# Patient Record
Sex: Male | Born: 1970 | Race: White | Hispanic: No | Marital: Married | Smoking: Never smoker
Health system: Southern US, Community
[De-identification: ages and names within clinical notes are randomized; demographics above are authoritative.]

## PROBLEM LIST (undated history)

## (undated) DIAGNOSIS — E785 Hyperlipidemia, unspecified: Secondary | ICD-10-CM

## (undated) DIAGNOSIS — J45909 Unspecified asthma, uncomplicated: Secondary | ICD-10-CM

## (undated) DIAGNOSIS — I10 Essential (primary) hypertension: Secondary | ICD-10-CM

## (undated) DIAGNOSIS — G4730 Sleep apnea, unspecified: Secondary | ICD-10-CM

## (undated) DIAGNOSIS — K649 Unspecified hemorrhoids: Secondary | ICD-10-CM

## (undated) DIAGNOSIS — E119 Type 2 diabetes mellitus without complications: Secondary | ICD-10-CM

## (undated) DIAGNOSIS — M5416 Radiculopathy, lumbar region: Secondary | ICD-10-CM

## (undated) DIAGNOSIS — Z8489 Family history of other specified conditions: Secondary | ICD-10-CM

## (undated) HISTORY — DX: Type 2 diabetes mellitus without complications: E11.9

## (undated) HISTORY — DX: Essential (primary) hypertension: I10

---

## 2015-08-27 ENCOUNTER — Encounter (HOSPITAL_COMMUNITY): Payer: Self-pay | Admitting: *Deleted

## 2015-08-27 ENCOUNTER — Emergency Department (HOSPITAL_COMMUNITY): Payer: Worker's Compensation

## 2015-08-27 ENCOUNTER — Emergency Department (HOSPITAL_COMMUNITY)
Admission: EM | Admit: 2015-08-27 | Discharge: 2015-08-27 | Disposition: A | Discharge status: Home / Self Care | Payer: Worker's Compensation | Attending: Emergency Medicine | Admitting: Emergency Medicine

## 2015-08-27 DIAGNOSIS — M5126 Other intervertebral disc displacement, lumbar region: Secondary | ICD-10-CM | POA: Insufficient documentation

## 2015-08-27 DIAGNOSIS — M79604 Pain in right leg: Secondary | ICD-10-CM

## 2015-08-27 DIAGNOSIS — M62830 Muscle spasm of back: Secondary | ICD-10-CM | POA: Diagnosis not present

## 2015-08-27 DIAGNOSIS — M545 Low back pain: Secondary | ICD-10-CM | POA: Diagnosis present

## 2015-08-27 DIAGNOSIS — M79605 Pain in left leg: Secondary | ICD-10-CM | POA: Insufficient documentation

## 2015-08-27 MED ORDER — DICLOFENAC SODIUM 50 MG PO TBEC: 50.0000 mg | DELAYED_RELEASE_TABLET | Freq: Two times a day (BID) | ORAL | Status: DC

## 2015-08-27 MED ORDER — OXYCODONE-ACETAMINOPHEN 5-325 MG PO TABS
1.0000 | ORAL_TABLET | Freq: Once | ORAL | Status: AC
Start: 2015-08-27 — End: 2015-08-27
  Administered 2015-08-27: 1 via ORAL
  Filled 2015-08-27: qty 1

## 2015-08-27 MED ORDER — PREDNISONE 10 MG PO TABS: 20.0000 mg | ORAL_TABLET | Freq: Two times a day (BID) | ORAL | Status: DC

## 2015-08-27 MED ORDER — OXYCODONE-ACETAMINOPHEN 7.5-325 MG PO TABS: 1.0000 | ORAL_TABLET | ORAL | Status: DC | PRN

## 2015-08-27 MED ORDER — KETOROLAC TROMETHAMINE 60 MG/2ML IM SOLN
60.0000 mg | Freq: Once | INTRAMUSCULAR | Status: AC
  Administered 2015-08-27: 60 mg via INTRAMUSCULAR
  Filled 2015-08-27: qty 2

## 2015-08-27 MED ORDER — CYCLOBENZAPRINE HCL 10 MG PO TABS
10.0000 mg | ORAL_TABLET | Freq: Once | ORAL | Status: AC
  Administered 2015-08-27: 10 mg via ORAL
  Filled 2015-08-27: qty 1

## 2015-08-27 MED ORDER — HYDROMORPHONE HCL 1 MG/ML IJ SOLN
1.0000 mg | Freq: Once | INTRAMUSCULAR | Status: AC
  Administered 2015-08-27: 1 mg via INTRAMUSCULAR
  Filled 2015-08-27: qty 1

## 2015-08-27 NOTE — ED Notes (Signed)
Declined W/C at D/C and was escorted to lobby by RN. 

## 2015-08-27 NOTE — Discharge Instructions (Signed)
[facility] tomorrow to schedule an appointment with the neurosurgeon. Do not drive or work while taking the narcotic. Return as needed.

## 2015-08-27 NOTE — ED Provider Notes (Signed)
CSN: [id number]     Arrival date & time 08/27/15  1234 History  By signing my name below, I, Jarvis Morgan, attest that this documentation has been prepared under the direction and in the presence of Kerrie Buffalo, NP Electronically Signed: Jarvis Morgan, ED Scribe. 08/27/2015. 8:51 PM.  Chief Complaint  Patient presents with  . Back Pain   Patient is a 44 y.o. male presenting with back pain. The history is provided by the patient. No language interpreter was used.  Back Pain Location:  Lumbar spine Radiates to:  L foot and R foot Pain severity:  Moderate Pain is:  Same all the time Onset quality:  Gradual Duration:  2 days Timing:  Constant Progression:  Waxing and waning Chronicity:  New Context: occupational injury and physical stress   Relieved by:  Nothing Worsened by:  Ambulation and standing Associated symptoms: no abdominal pain, no bladder incontinence, no bowel incontinence, no chest pain, no numbness, no perianal numbness, no tingling and no weakness     HPI Comments: Chalmer Zheng is a 44 y.o. male with no PMHx who presents to the Emergency Department complaining of constant, moderate, gradually worsening, 10/10, lower back pain onset 2 days. 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 Pt reports the pain is exacerbated with ambulation and standing straight up. He notes that he feels like both his knees have been "buckling" with ambulation since the injury. He endorses mild relief with rest and sitting. He denies h/o chronic back pain or back injuries. He is requesting a drug screen for his employer. Pt denies any urinary/bowel incontinence, urinary symptoms, abdominal pain, chest pain, saddles anesthesia, numbness, or weakness.   History reviewed. No pertinent past medical history. History reviewed. No pertinent past surgical  history. History reviewed. No pertinent family history. Social History  Substance Use Topics  . Smoking status: None  . Smokeless tobacco: None  . Alcohol Use: No    Review of Systems  Cardiovascular: Negative for chest pain.  Gastrointestinal: Negative for abdominal pain and bowel incontinence.  Genitourinary: Negative for bladder incontinence.  Musculoskeletal: Positive for back pain.       Bilateral lower extremity pain  Neurological: Negative for tingling, weakness and numbness.  All other systems reviewed and are negative.     Allergies  Bee venom  Home Medications   Prior to Admission medications   Medication Sig Start Date End Date Taking? Authorizing Provider  diclofenac (VOLTAREN) 50 MG EC tablet Take 1 tablet (50 mg total) by mouth 2 (two) times daily. 08/27/15   Talton Delpriore Orlene Och, NP  oxyCODONE-acetaminophen (PERCOCET) 7.5-325 MG tablet Take 1 tablet by mouth every 4 (four) hours as needed for severe pain. 08/27/15   Jemario Poitras Orlene Och, NP  predniSONE (DELTASONE) 10 MG tablet Take 2 tablets (20 mg total) by mouth 2 (two) times daily with a meal. 08/27/15   Janett Kamath Orlene Och, NP   Triage Vitals: BP 148/101 mmHg  Pulse 57  Temp(Src) 98.6 F (37 C) (Oral)  Resp 17  SpO2 98%  Physical Exam  Constitutional: He is oriented to person, place, and time. He appears well-developed and well-nourished. No distress.  HENT:  Head: Normocephalic and atraumatic.  Eyes: EOM are normal. Pupils are equal, round, and reactive to light.  Neck: Normal range of motion. Neck supple.  Cardiovascular: Normal rate and regular rhythm.   Pulses:      Radial pulses are 2+ on the right side, and 2+ on the left side.       Dorsalis pedis pulses are 2+ on the right side, and 2+ on the left side.  Adequate circulation  Pulmonary/Chest: Effort normal. No respiratory distress. He has no wheezes. He has no rales.  Abdominal: Soft. Bowel sounds are normal. There is no tenderness.  Musculoskeletal: He  exhibits no edema.       Lumbar back: He exhibits decreased range of motion, tenderness and spasm. He exhibits no deformity and normal pulse.       Back:  There is tenderness with palpation of the lumbar spine with pain radiating to bilateral lower extremities. Pedal pulses 2+, adequate circulation. Difficulty with ambulation due to severe pain.   Neurological: He is alert and oriented to person, place, and time. He has normal strength. No cranial nerve deficit or sensory deficit. Coordination and gait normal.  Reflex Scores:      Bicep reflexes are 2+ on the right side and 2+ on the left side.      Brachioradialis reflexes are 2+ on the right side and 2+ on the left side.      Patellar reflexes are 2+ on the right side and 2+ on the left side.      Achilles reflexes are 2+ on the right side and 2+ on the left side. Grips are equal,Good touch sensation, equal strength lower extremities. Ambulatory without foot drag.    Skin: Skin is warm and dry.  Psychiatric: He has a normal mood and affect. His behavior is normal.  Nursing note and vitals reviewed.   ED Course  Procedures (including critical care time) MRI of lumbar spine Pain management  DIAGNOSTIC STUDIES: Oxygen Saturation is 98% on RA, normal by my interpretation.    COORDINATION OF CARE: 1:44 PM- Will order MRI of lumbar spine w/o contrast.  Pt advised of plan for treatment and pt agrees.  Labs Review Labs Reviewed - No data to display  Imaging Review Mr Lumbar Spine Wo Contrast  08/27/2015   CLINICAL DATA:  44 year old male low back pain. Injured at work a few days ago. Initial encounter.  EXAM: MRI LUMBAR SPINE WITHOUT CONTRAST  TECHNIQUE: Multiplanar, multisequence MR imaging of the lumbar spine was performed. No intravenous contrast was administered.  COMPARISON:  None.  FINDINGS: Last fully open disk space is labeled L5-S1. Present examination incorporates from T11-12 disc space through S3. Conus L1 level.  Visualized  paravertebral structures unremarkable.  T11-12 through L3-4:  Negative.  L4-5: Mild disc degeneration. Bulge with small central protrusion. No significant spinal stenosis or nerve root compression.  L5-S1: Mild disc degeneration and endplate reactive changes. Dysplastic posterior elements. Bilateral L5 pars defects. Bulge without spinal stenosis. Minimal spur right foraminal/ lateral position without compression of exiting right L5 nerve root.  IMPRESSION: L4-5 bulge with small central protrusion. No significant spinal stenosis or nerve root compression.  L5-S1 mild disc degeneration and endplate reactive changes. Dysplastic posterior elements. Bilateral L5 pars defects. Bulge without spinal stenosis. Minimal spur right foraminal/ lateral position without compression of exiting right L5 nerve root.   Electronically Signed   By: Lacy Duverney M.D.   On: 08/27/2015 16:22   I have personally reviewed and evaluated these images results as part of my medical decision-making.  [facility] and they will call the patient with an  appointment for tomorrow or Wednesday.   MDM  44 y.o. male with low back pain that started 3 days ago and has gradually worsened. Will treat for pain and inflammation and he will follow up with the Neurosurgeon. They will call him tomorrow to schedule a time. Stable for d/c without neuro deficits at this time.   Final diagnoses:  Bilateral leg pain  Protruded lumbar disc   I personally performed the services described in this documentation, which was scribed in my presence. The recorded information has been reviewed and is accurate.   [address] [place], NP 08/27/15 2059  Mirian Mo, MD 09/01/15 1800

## 2015-08-27 NOTE — ED Notes (Addendum)
Pt reports injuring lower back while at work Saturday and having pain since. Needs a workers comp drug screen.

## 2017-01-18 IMAGING — MR MR LUMBAR SPINE W/O CM
4 of 5 series · 19 of 48 positions shown · non-contrast
Comparison: None.

CLINICAL DATA: 45-year-old male low back pain. Injured at work a
few days ago. Initial encounter.

EXAM:
MRI LUMBAR SPINE WITHOUT CONTRAST
TECHNIQUE: Multiplanar, multisequence MR imaging of the lumbar spine was
performed. No intravenous contrast was administered.

[Series 2: T2 · sagittal · 4.5mm · 0.55mm/px · 6 of 17 slices shown (1 of 2)]
[im 1/17]
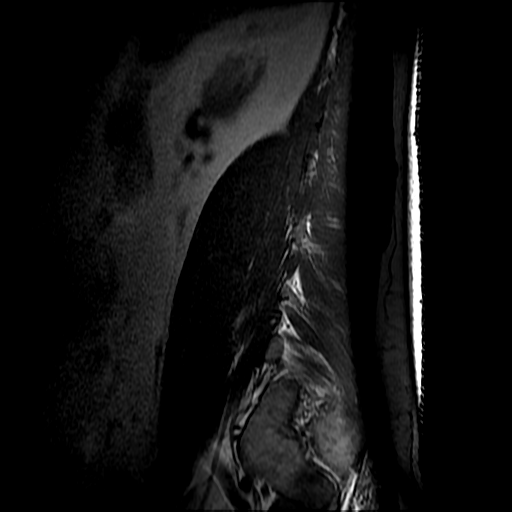
[im 4/17]
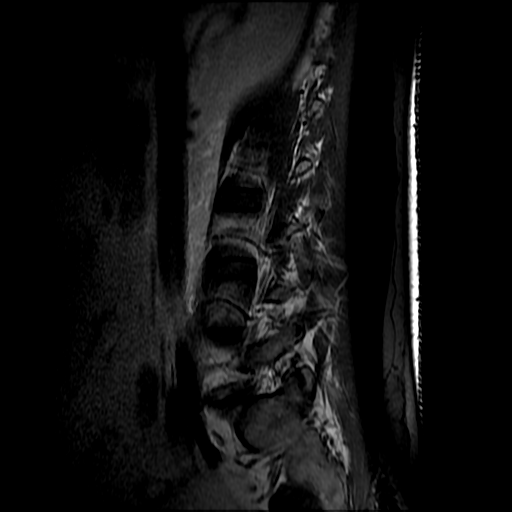
[im 7/17]
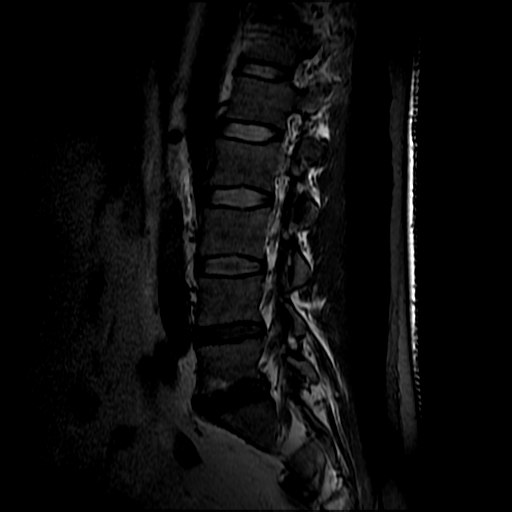
[im 10/17]
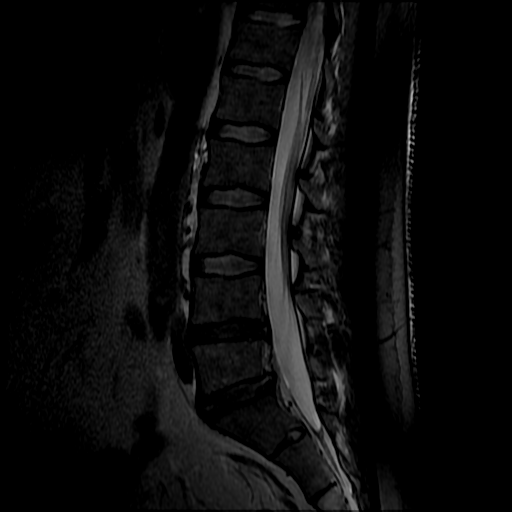
[im 13/17]
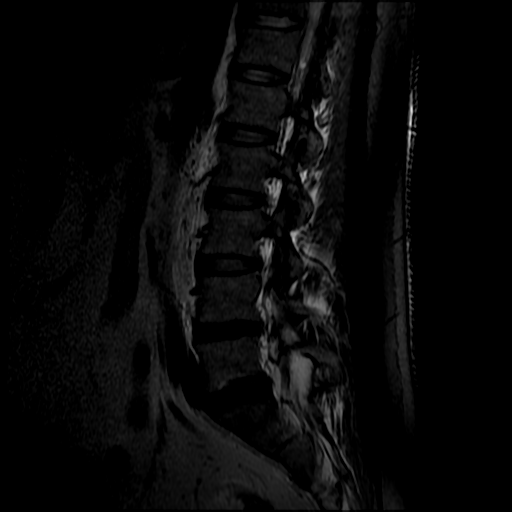
[im 17/17]
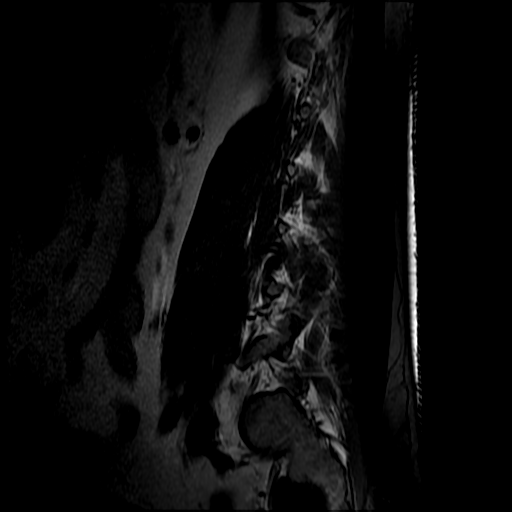

[Series 4: T1 · sagittal · 4.5mm · 0.55mm/px · 3 of 17 slices shown (1 of 2)]
[im 3/17]
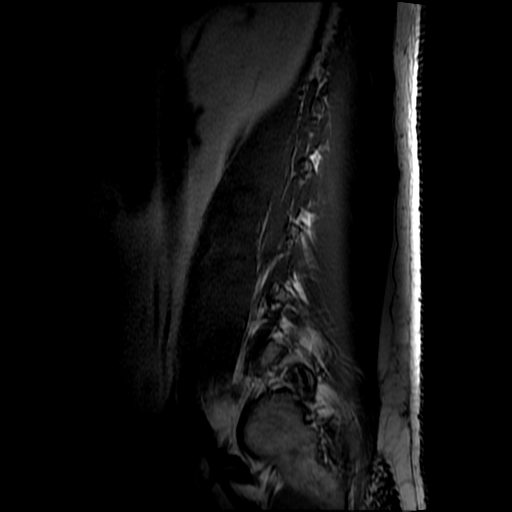
[im 9/17]
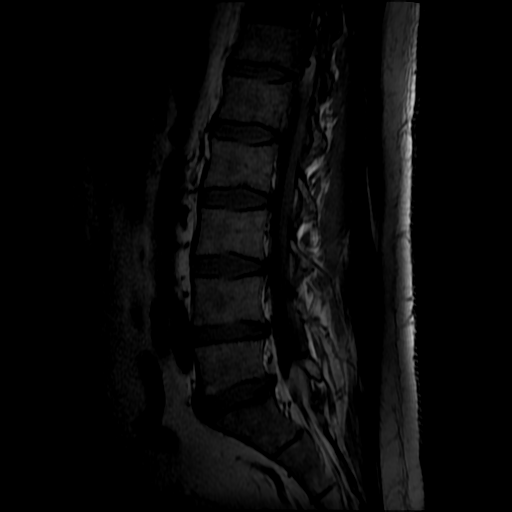
[im 14/17]
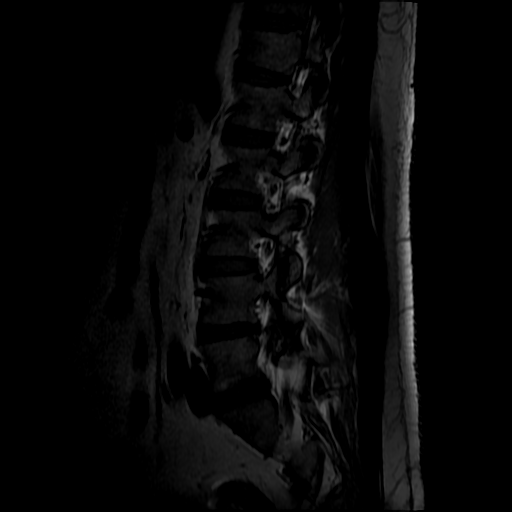

[Series 5: T2 · axial · 4.0mm · 0.39mm/px · z∈[-31,+126]mm · 7 of 35 slices shown (2 of 2)]
[im 1/35]
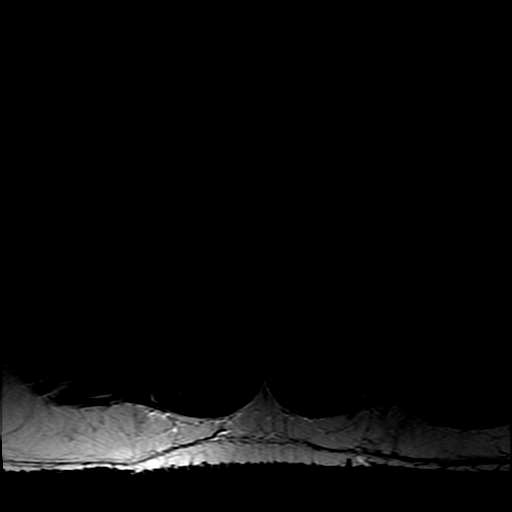
[im 6/35]
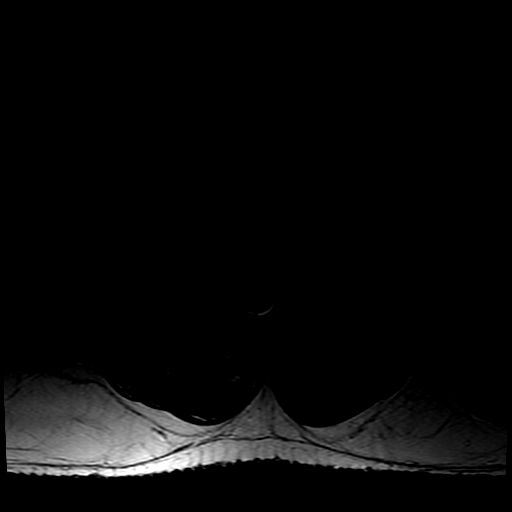
[im 11/35]
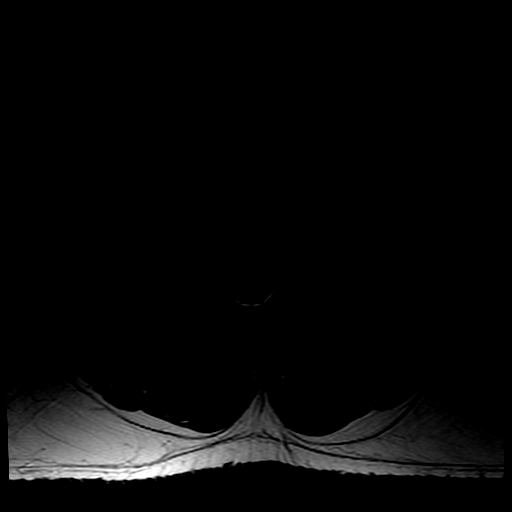
[im 16/35]
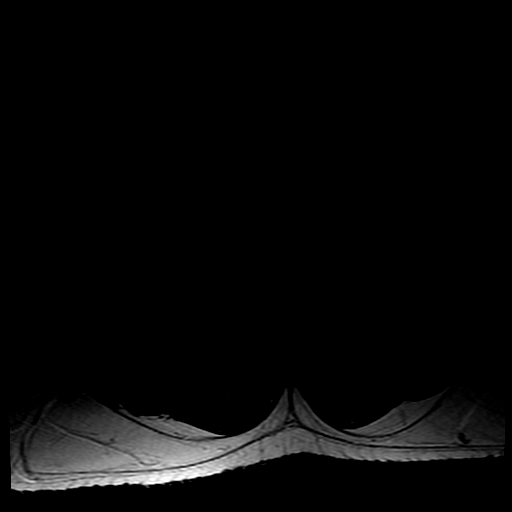
[im 19/35]
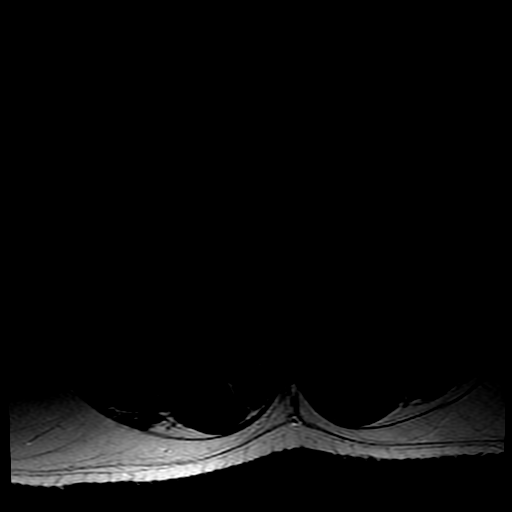
[im 24/35]
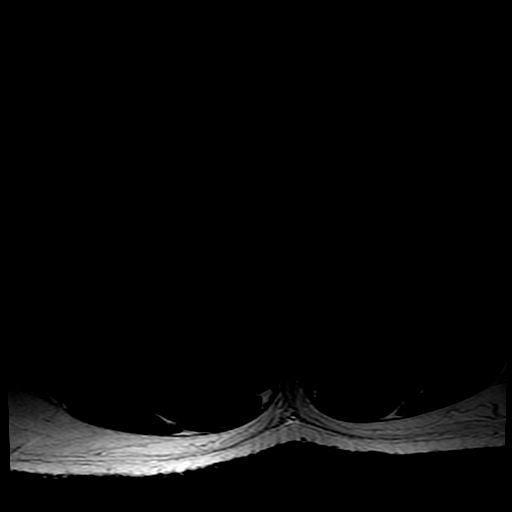
[im 29/35]
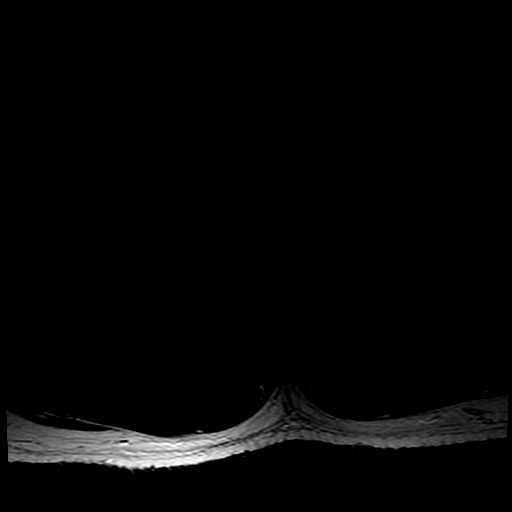

[Series 6: T1 · axial · 4.0mm · 0.39mm/px · z∈[-6,+126]mm · 3 of 35 slices shown (2 of 2)]
[im 6/35]
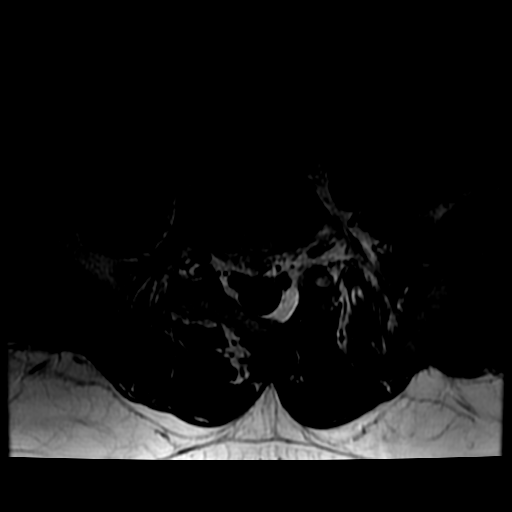
[im 19/35]
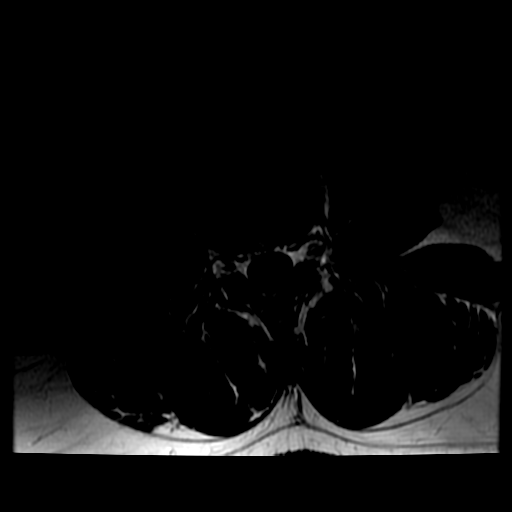
[im 29/35]
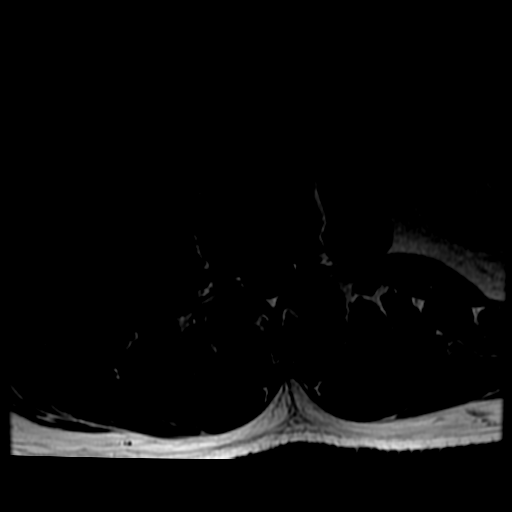

[19 of 48 positions shown; findings below may reference images not displayed]

FINDINGS: Last fully open disk space is labeled L5-S1. Present examination
incorporates from T11-12 disc space through S3. Conus L1 level.

Visualized paravertebral structures unremarkable.

T11-12 through L3-4:  Negative.

L4-5: Mild disc degeneration. Bulge with small central protrusion.
No significant spinal stenosis or nerve root compression.

L5-S1: Mild disc degeneration and endplate reactive changes.
Dysplastic posterior elements. Bilateral L5 pars defects. Bulge
without spinal stenosis. Minimal spur right foraminal/ lateral
position without compression of exiting right L5 nerve root.
IMPRESSION: L4-5 bulge with small central protrusion. No significant spinal
stenosis or nerve root compression.

L5-S1 mild disc degeneration and endplate reactive changes.
Dysplastic posterior elements. Bilateral L5 pars defects. Bulge
without spinal stenosis. Minimal spur right foraminal/ lateral
position without compression of exiting right L5 nerve root.

## 2019-10-07 ENCOUNTER — Ambulatory Visit
Admission: EM | Admit: 2019-10-07 | Discharge: 2019-10-07 | Disposition: A | Discharge status: Home / Self Care | Payer: BLUE CROSS/BLUE SHIELD | Attending: Physician Assistant | Admitting: Physician Assistant

## 2019-10-07 ENCOUNTER — Other Ambulatory Visit: Payer: Self-pay

## 2019-10-07 DIAGNOSIS — M545 Low back pain, unspecified: Secondary | ICD-10-CM

## 2019-10-07 MED ORDER — PREDNISONE 10 MG (21) PO TBPK: ORAL_TABLET | Freq: Every day | ORAL | 0 refills | Status: DC

## 2019-10-07 NOTE — ED Triage Notes (Signed)
Pt c/o lower back since Monday after moving a pool table

## 2019-10-07 NOTE — Discharge Instructions (Signed)
Start prednisone as directed. Ice/heat compresses as needed. This can take up to 3-4 weeks to completely resolve, but you should be feeling better each week. Follow up here or with PCP if symptoms worsen, changes for reevaluation. If experience numbness/tingling of the inner thighs, loss of bladder or bowel control, go to the emergency department for evaluation.

## 2019-10-07 NOTE — ED Provider Notes (Signed)
[facility]    CSN: [id number] Arrival date & time: 10/07/19  1548      History   Chief Complaint Chief Complaint  Patient presents with  . Back Pain    HPI Jeremiah Brock is a 48 y.o. male.   48 year old male comes in for 5-day history of right-sided back pain that can radiate to the left.  States pain started after moving a pool table.  Denies saddle anesthesia, loss of bladder or bowel control.  Denies radiation down the legs.  Pain is intermittent, worse with movement.  Has not tried anything for the symptoms.     History reviewed. No pertinent past medical history.  There are no active problems to display for this patient.   History reviewed. No pertinent surgical history.     Home Medications    Prior to Admission medications   Medication Sig Start Date End Date Taking? Authorizing Provider  predniSONE (STERAPRED UNI-PAK 21 TAB) 10 MG (21) TBPK tablet Take by mouth daily. Take 6 tabs by mouth day 1, then 5 tabs, then 4 tabs, then 3 tabs, 2 tabs, then 1 tab for the last day 10/07/19   Ok Edwards, PA-C    Family History No family history on file.  Social History Social History   Tobacco Use  . Smoking status: Never Smoker  . Smokeless tobacco: Never Used  Substance Use Topics  . Alcohol use: No  . Drug use: No     Allergies   Bee venom   Review of Systems Review of Systems  Reason unable to perform ROS: See HPI as above.     Physical Exam Triage Vital Signs ED Triage Vitals  Enc Vitals Group     BP 10/07/19 1617 134/87     Pulse Rate 10/07/19 1617 89     Resp 10/07/19 1617 16     Temp 10/07/19 1617 98.1 F (36.7 C)     Temp Source 10/07/19 1617 Oral     SpO2 10/07/19 1617 97 %     Weight --      Height --      Head Circumference --      Peak Flow --      Pain Score 10/07/19 1621 8     Pain Loc --      Pain Edu? --      Excl. in [place]? --    No data found.  Updated Vital Signs BP 134/87 (BP Location: Left Arm)    Pulse 89   Temp 98.1 F (36.7 C) (Oral)   Resp 16   SpO2 97%   Physical Exam Constitutional:      General: He is not in acute distress.    Appearance: He is well-developed. He is not diaphoretic.  HENT:     Head: Normocephalic and atraumatic.  Eyes:     Conjunctiva/sclera: Conjunctivae normal.     Pupils: Pupils are equal, round, and reactive to light.  Cardiovascular:     Rate and Rhythm: Normal rate and regular rhythm.     Heart sounds: Normal heart sounds. No murmur. No friction rub. No gallop.   Pulmonary:     Effort: Pulmonary effort is normal. No accessory muscle usage or respiratory distress.     Breath sounds: Normal breath sounds. No stridor. No decreased breath sounds, wheezing, rhonchi or rales.  Musculoskeletal:     Comments: No tenderness on palpation of the spinous processes. Tenderness to palpation of right lumbar region. Full range  of motion of back and hips. Strength normal and equal bilaterally. Sensation intact and equal bilaterally.  Negative straight leg raise.   Skin:    General: Skin is warm and dry.  Neurological:     Mental Status: He is alert and oriented to person, place, and time.    UC Treatments / Results  Labs (all labs ordered are listed, but only abnormal results are displayed) Labs Reviewed - No data to display  EKG   Radiology No results found.  Procedures Procedures (including critical care time)  Medications Ordered in UC Medications - No data to display  Initial Impression / Assessment and Plan / UC Course  I have reviewed the triage vital signs and the nursing notes.  Pertinent labs & imaging results that were available during my care of the patient were reviewed by me and considered in my medical decision making (see chart for details).    No alarming signs on exam.  Patient states history of the same, and has had good results with prednisone.  Prednisone prescribed.  Patient would like to defer muscle relaxer at this time.   Return precautions given.  Patient expresses understanding and agrees to plan.  Final Clinical Impressions(s) / UC Diagnoses   Final diagnoses:  Acute right-sided low back pain without sciatica   ED Prescriptions    Medication Sig Dispense Auth. Provider   predniSONE (STERAPRED UNI-PAK 21 TAB) 10 MG (21) TBPK tablet Take by mouth daily. Take 6 tabs by mouth day 1, then 5 tabs, then 4 tabs, then 3 tabs, 2 tabs, then 1 tab for the last day 21 tablet Belinda Fisher, PA-C     PDMP not reviewed this encounter.   Belinda Fisher, PA-C 10/07/19 1824

## 2020-02-01 ENCOUNTER — Other Ambulatory Visit: Payer: Self-pay

## 2020-02-01 ENCOUNTER — Encounter: Payer: Self-pay | Admitting: Internal Medicine

## 2020-02-01 ENCOUNTER — Other Ambulatory Visit
Admission: RE | Admit: 2020-02-01 | Discharge: 2020-02-01 | Disposition: A | Discharge status: Home / Self Care | Payer: 59 | Source: Ambulatory Visit | Attending: Internal Medicine | Admitting: Internal Medicine

## 2020-02-01 DIAGNOSIS — Z01812 Encounter for preprocedural laboratory examination: Secondary | ICD-10-CM | POA: Diagnosis not present

## 2020-02-01 DIAGNOSIS — Z20822 Contact with and (suspected) exposure to covid-19: Secondary | ICD-10-CM | POA: Diagnosis not present

## 2020-02-02 ENCOUNTER — Ambulatory Visit: Admission: RE | Admit: 2020-02-02 | Payer: 59 | Source: Home / Self Care | Admitting: Internal Medicine

## 2020-02-02 ENCOUNTER — Encounter: Admission: RE | Payer: Self-pay | Source: Home / Self Care

## 2020-02-02 LAB — SARS CORONAVIRUS 2 (TAT 6-24 HRS): SARS Coronavirus 2: NEGATIVE

## 2020-02-02 SURGERY — COLONOSCOPY WITH PROPOFOL
Anesthesia: General

## 2021-12-13 DIAGNOSIS — G4733 Obstructive sleep apnea (adult) (pediatric): Secondary | ICD-10-CM | POA: Diagnosis not present

## 2022-01-06 DIAGNOSIS — Z87891 Personal history of nicotine dependence: Secondary | ICD-10-CM | POA: Diagnosis not present

## 2022-01-06 DIAGNOSIS — R079 Chest pain, unspecified: Secondary | ICD-10-CM | POA: Diagnosis not present

## 2022-01-06 DIAGNOSIS — E119 Type 2 diabetes mellitus without complications: Secondary | ICD-10-CM | POA: Diagnosis not present

## 2022-01-06 DIAGNOSIS — I10 Essential (primary) hypertension: Secondary | ICD-10-CM | POA: Diagnosis not present

## 2022-01-06 DIAGNOSIS — G4733 Obstructive sleep apnea (adult) (pediatric): Secondary | ICD-10-CM | POA: Diagnosis not present

## 2022-01-06 DIAGNOSIS — Z1331 Encounter for screening for depression: Secondary | ICD-10-CM | POA: Diagnosis not present

## 2022-01-06 DIAGNOSIS — M25511 Pain in right shoulder: Secondary | ICD-10-CM | POA: Diagnosis not present

## 2022-01-13 DIAGNOSIS — G4733 Obstructive sleep apnea (adult) (pediatric): Secondary | ICD-10-CM | POA: Diagnosis not present

## 2022-02-10 DIAGNOSIS — G4733 Obstructive sleep apnea (adult) (pediatric): Secondary | ICD-10-CM | POA: Diagnosis not present

## 2022-03-13 DIAGNOSIS — G4733 Obstructive sleep apnea (adult) (pediatric): Secondary | ICD-10-CM | POA: Diagnosis not present

## 2022-04-12 DIAGNOSIS — G4733 Obstructive sleep apnea (adult) (pediatric): Secondary | ICD-10-CM | POA: Diagnosis not present

## 2022-04-17 DIAGNOSIS — M5136 Other intervertebral disc degeneration, lumbar region: Secondary | ICD-10-CM | POA: Diagnosis not present

## 2022-04-17 DIAGNOSIS — M545 Low back pain, unspecified: Secondary | ICD-10-CM | POA: Diagnosis not present

## 2022-05-13 DIAGNOSIS — G4733 Obstructive sleep apnea (adult) (pediatric): Secondary | ICD-10-CM | POA: Diagnosis not present

## 2022-08-30 DIAGNOSIS — M545 Low back pain, unspecified: Secondary | ICD-10-CM | POA: Diagnosis not present

## 2023-03-02 ENCOUNTER — Encounter: Payer: Self-pay | Admitting: Nurse Practitioner

## 2023-04-29 DIAGNOSIS — I10 Essential (primary) hypertension: Secondary | ICD-10-CM | POA: Insufficient documentation

## 2023-04-29 DIAGNOSIS — S99912A Unspecified injury of left ankle, initial encounter: Secondary | ICD-10-CM | POA: Diagnosis present

## 2023-04-29 DIAGNOSIS — S82842A Displaced bimalleolar fracture of left lower leg, initial encounter for closed fracture: Secondary | ICD-10-CM | POA: Diagnosis not present

## 2023-04-30 ENCOUNTER — Emergency Department: Payer: BLUE CROSS/BLUE SHIELD

## 2023-04-30 ENCOUNTER — Emergency Department
Admission: EM | Admit: 2023-04-30 | Discharge: 2023-04-30 | Disposition: A | Discharge status: Home / Self Care | Payer: BLUE CROSS/BLUE SHIELD | Attending: Emergency Medicine | Admitting: Emergency Medicine

## 2023-04-30 ENCOUNTER — Other Ambulatory Visit: Payer: Self-pay

## 2023-04-30 DIAGNOSIS — S82842A Displaced bimalleolar fracture of left lower leg, initial encounter for closed fracture: Secondary | ICD-10-CM

## 2023-04-30 MED ORDER — ACETAMINOPHEN 500 MG PO TABS
1000.0000 mg | ORAL_TABLET | Freq: Once | ORAL | Status: DC
  Filled 2023-04-30: qty 2

## 2023-04-30 MED ORDER — IBUPROFEN 400 MG PO TABS
400.0000 mg | ORAL_TABLET | Freq: Once | ORAL | Status: AC
  Administered 2023-04-30: 400 mg via ORAL
  Filled 2023-04-30: qty 1

## 2023-04-30 MED ORDER — OXYCODONE HCL 5 MG PO TABS: 5.0000 mg | ORAL_TABLET | Freq: Three times a day (TID) | ORAL | 0 refills | Status: AC | PRN

## 2023-04-30 MED ORDER — OXYCODONE HCL 5 MG PO TABS
5.0000 mg | ORAL_TABLET | Freq: Once | ORAL | Status: AC
  Administered 2023-04-30: 5 mg via ORAL
  Filled 2023-04-30: qty 1
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
## 2023-04-30 NOTE — Discharge Instructions (Signed)
Please keep the splint in place and do not put any weight on your ankle.  You can take Tylenol Motrin for pain and the oxycodone as daily as needed for severe pain.  You will need surgery for this injury.  Please call podiatry in the morning to schedule an appointment.

## 2023-11-18 DIAGNOSIS — B029 Zoster without complications: Secondary | ICD-10-CM

## 2023-11-18 HISTORY — DX: Zoster without complications: B02.9

## 2024-10-22 ENCOUNTER — Encounter: Payer: Self-pay | Admitting: Emergency Medicine

## 2024-10-22 ENCOUNTER — Emergency Department
Admission: EM | Admit: 2024-10-22 | Discharge: 2024-10-22 | Disposition: A | Discharge status: Home / Self Care | Attending: Emergency Medicine | Admitting: Emergency Medicine

## 2024-10-22 ENCOUNTER — Other Ambulatory Visit: Payer: Self-pay

## 2024-10-22 DIAGNOSIS — K645 Perianal venous thrombosis: Secondary | ICD-10-CM | POA: Insufficient documentation

## 2024-10-22 LAB — BASIC METABOLIC PANEL WITH GFR
Anion gap: 13 (ref 5–15)
BUN: 18 mg/dL (ref 6–20)
CO2: 22 mmol/L (ref 22–32)
Calcium: 8.1 mg/dL — ABNORMAL LOW (ref 8.9–10.3)
Chloride: 96 mmol/L — ABNORMAL LOW (ref 98–111)
Creatinine, Ser: 0.89 mg/dL (ref 0.61–1.24)
GFR, Estimated: >60 mL/min (ref >60)
Glucose, Bld: 114 mg/dL — ABNORMAL HIGH (ref 70–99)
Potassium: 4.4 mmol/L (ref 3.5–5.1)
Sodium: 131 mmol/L — ABNORMAL LOW (ref 135–145)

## 2024-10-22 LAB — CBC WITH DIFFERENTIAL/PLATELET
Abs Immature Granulocytes: 0.74 K/uL — ABNORMAL HIGH (ref 0.00–0.07)
Basophils Absolute: 0.1 K/uL (ref 0.0–0.1)
Basophils Relative: 1 %
Eosinophils Absolute: 0 K/uL (ref 0.0–0.5)
Eosinophils Relative: 0 %
HCT: 48.7 % (ref 39.0–52.0)
Hemoglobin: 14.9 g/dL (ref 13.0–17.0)
Immature Granulocytes: 4 %
Lymphocytes Relative: 10 %
Lymphs Abs: 1.7 K/uL (ref 0.7–4.0)
MCH: 23.3 pg — ABNORMAL LOW (ref 26.0–34.0)
MCHC: 30.6 g/dL (ref 30.0–36.0)
MCV: 76.2 fL — ABNORMAL LOW (ref 80.0–100.0)
Monocytes Absolute: 1.1 K/uL — ABNORMAL HIGH (ref 0.1–1.0)
Monocytes Relative: 6 %
Neutro Abs: 13.9 K/uL — ABNORMAL HIGH (ref 1.7–7.7)
Neutrophils Relative %: 79 %
Platelets: 256 K/uL (ref 150–400)
RBC: 6.39 MIL/uL — ABNORMAL HIGH (ref 4.22–5.81)
RDW: 16.1 % — ABNORMAL HIGH (ref 11.5–15.5)
WBC: 17.6 K/uL — ABNORMAL HIGH (ref 4.0–10.5)
nRBC: 0.1 % (ref 0.0–0.2)

## 2024-10-22 MED ORDER — LIDOCAINE HCL (PF) 1 % IJ SOLN
INTRAMUSCULAR | Status: DC
  Filled 2024-10-22: qty 10

## 2024-10-22 MED ORDER — OXYCODONE-ACETAMINOPHEN 5-325 MG PO TABS: 2.0000 | ORAL_TABLET | ORAL | 0 refills | Status: AC | PRN

## 2024-10-22 MED ORDER — SENNOSIDES-DOCUSATE SODIUM 8.6-50 MG PO TABS: 2.0000 | ORAL_TABLET | Freq: Two times a day (BID) | ORAL | 0 refills | Status: DC

## 2024-10-22 MED ORDER — LIDOCAINE-EPINEPHRINE 2 %-1:100000 IJ SOLN
20.0000 mL | Freq: Once | INTRAMUSCULAR | Status: AC
  Administered 2024-10-22: 20 mL
  Filled 2024-10-22: qty 1

## 2024-10-22 MED ORDER — HYDROMORPHONE HCL 1 MG/ML IJ SOLN
1.0000 mg | Freq: Once | INTRAMUSCULAR | Status: AC
  Administered 2024-10-22: 1 mg via INTRAVENOUS
  Filled 2024-10-22: qty 1

## 2024-10-22 MED ORDER — OXYCODONE-ACETAMINOPHEN 5-325 MG PO TABS
2.0000 | ORAL_TABLET | Freq: Once | ORAL | Status: AC
  Administered 2024-10-22: 2 via ORAL
  Filled 2024-10-22: qty 2

## 2024-10-22 MED ORDER — LIDOCAINE-EPINEPHRINE-TETRACAINE (LET) TOPICAL GEL
3.0000 mL | Freq: Once | TOPICAL | Status: AC
  Administered 2024-10-22: 3 mL via TOPICAL
  Filled 2024-10-22: qty 3

## 2024-10-22 MED ORDER — KETOROLAC TROMETHAMINE 15 MG/ML IJ SOLN
15.0000 mg | Freq: Once | INTRAMUSCULAR | Status: AC
  Administered 2024-10-22: 15 mg via INTRAVENOUS
  Filled 2024-10-22: qty 1

## 2024-10-22 MED ORDER — HYDROCORTISONE ACETATE 25 MG RE SUPP: 25.0000 mg | Freq: Two times a day (BID) | RECTAL | 0 refills | Status: AC

## 2024-10-22 NOTE — ED Provider Notes (Signed)
 [facility] Provider Note    Event Date/Time   First MD Initiated Contact with Patient 10/22/24 1747     (approximate)   History   Chief Complaint: Rectal Pain   HPI  Jeremiah Brock is a 53 y.o. male with past history of stomach ulcers who comes ED complaining of bulging at the anus and pain with trying to have a bowel movement.  He has been having issues with constipation and straining, took MiraLAX earlier today, but then when he went to the bathroom he noticed fresh red bleeding into the toilet.  Denies chest pain shortness of breath fever or lightheadedness        History reviewed. No pertinent past medical history.  Current Outpatient Rx   Order #: [id number] Class: Normal   Order #: [id number] Class: Normal   Order #: [id number] Class: Normal   Order #: [id number] Class: Normal    History reviewed. No pertinent surgical history.  Physical Exam   Triage Vital Signs: ED Triage Vitals  Encounter Vitals Group     BP 10/22/24 1737 (!) 128/90     Girls Systolic BP Percentile --      Girls Diastolic BP Percentile --      Boys Systolic BP Percentile --      Boys Diastolic BP Percentile --      Pulse Rate 10/22/24 1737 (!) 103     Resp 10/22/24 1737 19     Temp 10/22/24 1737 98 F (36.7 C)     Temp Source 10/22/24 1737 Oral     SpO2 10/22/24 1737 100 %     Weight 10/22/24 1737 238 lb (108 kg)     Height 10/22/24 1737 6' 3 (1.905 m)     Head Circumference --      Peak Flow --      Pain Score 10/22/24 1742 10     Pain Loc --      Pain Education --      Exclude from Growth Chart --     Most recent vital signs: Vitals:   10/22/24 1737  BP: (!) 128/90  Pulse: (!) 103  Resp: 19  Temp: 98 F (36.7 C)  SpO2: 100%    General: Awake, no distress.  CV:  Good peripheral perfusion.  Regular rate rhythm Resp:  Normal effort.  Abd:  No distention.  Soft nontender Other:  Anal exam shows large circumferential external hemorrhoids with  thrombosis   ED Results / Procedures / Treatments   Labs (all labs ordered are listed, but only abnormal results are displayed) Labs Reviewed  BASIC METABOLIC PANEL WITH GFR - Abnormal; Notable for the following components:      Result Value   Sodium 131 (*)    Chloride 96 (*)    Glucose, Bld 114 (*)    Calcium 8.1 (*)    All other components within normal limits  CBC WITH DIFFERENTIAL/PLATELET - Abnormal; Notable for the following components:   WBC 17.6 (*)    RBC 6.39 (*)    MCV 76.2 (*)    MCH 23.3 (*)    RDW 16.1 (*)    Neutro Abs 13.9 (*)    Monocytes Absolute 1.1 (*)    Abs Immature Granulocytes 0.74 (*)    All other components within normal limits  URINALYSIS, W/ REFLEX TO CULTURE (INFECTION SUSPECTED)     EKG    RADIOLOGY    PROCEDURES:  Procedures   MEDICATIONS ORDERED IN ED: Medications  lidocaine  (  PF) (XYLOCAINE ) 1 % injection (has no administration in time range)  HYDROmorphone  (DILAUDID ) injection 1 mg (1 mg Intravenous Given 10/22/24 1805)  lidocaine -EPINEPHrine  (XYLOCAINE  W/EPI) 2 %-1:100000 (with pres) injection 20 mL (20 mLs Infiltration Given 10/22/24 1828)  lidocaine -EPINEPHrine -tetracaine  (LET) topical gel (3 mLs Topical Given 10/22/24 1848)  HYDROmorphone  (DILAUDID ) injection 1 mg (1 mg Intravenous Given 10/22/24 1910)  ketorolac  (TORADOL ) 15 MG/ML injection 15 mg (15 mg Intravenous Given 10/22/24 2113)  oxyCODONE -acetaminophen  (PERCOCET/ROXICET) 5-325 MG per tablet 2 tablet (2 tablets Oral Given 10/22/24 2147)     IMPRESSION / MDM / ASSESSMENT AND PLAN / ED COURSE  I reviewed the triage vital signs and the nursing notes.  DDx: Anemia, electrolyte derangement, AKI, thrombocytopenia  Patient's presentation is most consistent with acute presentation with potential threat to life or bodily function.  Patient presents with markedly engorged and thrombosed external hemorrhoids causing severe pain.  Will attempt thrombectomy.   Clinical Course  as of 10/22/24 2155  Sat Oct 22, 2024  1904 LET applied. Will give repeat dose of dilaudid . Pt agrees with hemorrhoid excision / decompression [PS]    Clinical Course User Index [PS] Viviann Pastor, MD    ----------------------------------------- 9:57 PM on 10/22/2024 ----------------------------------------- Hemorrhoid incision and thrombectomy attempted, but unable to achieve with local anesthetic alone within patient tolerance of the procedure.  The size and circumferential nature also complicates the procedure.  Will provide supportive care, recommend follow-up with gen. surgery this week for further management.   FINAL CLINICAL IMPRESSION(S) / ED DIAGNOSES   Final diagnoses:  Thrombosed external hemorrhoids     Rx / DC Orders   ED Discharge Orders          Ordered    oxyCODONE -acetaminophen  (PERCOCET) 5-325 MG tablet  Every 4 hours PRN        10/22/24 2155    hydrocortisone  (ANUSOL -HC) 25 MG suppository  Every 12 hours        10/22/24 2155    senna-docusate (SENOKOT-S) 8.6-50 MG tablet  2 times daily        10/22/24 2155             Note:  This document was prepared using Dragon voice recognition software and may include unintentional dictation errors.   Viviann Pastor, MD 10/22/24 2159

## 2024-10-22 NOTE — ED Triage Notes (Signed)
 Patient reports this morning when going to bathroom having rectal prolapse. Unable to void since.

## 2024-10-22 NOTE — ED Notes (Signed)
 MD at bedside performing procedure. Pt tolerating well

## 2024-10-24 ENCOUNTER — Ambulatory Visit: Admitting: Surgery

## 2024-10-24 ENCOUNTER — Telehealth: Payer: Self-pay | Admitting: Surgery

## 2024-10-24 ENCOUNTER — Encounter: Payer: Self-pay | Admitting: Surgery

## 2024-10-24 VITALS — BP 129/85 | HR 98 | Ht 75.0 in | Wt 234.0 lb

## 2024-10-24 DIAGNOSIS — K645 Perianal venous thrombosis: Secondary | ICD-10-CM

## 2024-10-24 DIAGNOSIS — K644 Residual hemorrhoidal skin tags: Secondary | ICD-10-CM

## 2024-10-24 MED ORDER — LIDOCAINE 5 % EX OINT: 1.0000 | TOPICAL_OINTMENT | CUTANEOUS | 0 refills | Status: DC | PRN

## 2024-10-24 MED ORDER — OXYCODONE HCL 5 MG PO TABS: 5.0000 mg | ORAL_TABLET | Freq: Four times a day (QID) | ORAL | 0 refills | Status: DC | PRN

## 2024-10-24 MED ORDER — HYDROCORTISONE (PERIANAL) 2.5 % EX CREA: 1.0000 | TOPICAL_CREAM | Freq: Two times a day (BID) | CUTANEOUS | 0 refills | Status: AC

## 2024-10-24 NOTE — Telephone Encounter (Signed)
 Pt was in ER on 10-22-24 in severe pain with 2 hominoids and was told that he needed surgery ASAP. The also went in and cut them and patient wife said was very gross (pt has video) and had very little numbing medications. Said the pt had the same needle stick him at least 30 or more. He is in severe pain and has an apt thru My Chart for 10-31-24 but is here in the office now and you have noting till then. Please advise. Pt said would wait till we here from you as what they can do.Pt # is [phone].

## 2024-10-24 NOTE — Patient Instructions (Addendum)
 We sent 3 prescriptions to your pharmacy and we will see you back in   Hemorrhoids Hemorrhoids are swollen veins that may form: In the butt (rectum). These are called internal hemorrhoids. Around the opening of the butt (anus). These are called external hemorrhoids. Most hemorrhoids do not cause very bad problems. They often get better with changes to your lifestyle and what you eat. What are the causes? Having trouble pooping (constipation) or watery poop (diarrhea). Pushing too hard when you poop. Pregnancy. Being very overweight (obese). Sitting for too long. Riding a bike for a long time. Heavy lifting or other things that take a lot of effort. Anal sex. What are the signs or symptoms? Pain. Itching or soreness in the butt. Bleeding from the butt. Leaking poop. Swelling. One or more lumps around the opening of your butt. How is this treated? In most cases, hemorrhoids can be treated at home. You may be told to: Change what you eat. Make changes to your lifestyle. If these treatments do not help, you may need to have a procedure done. Your doctor may need to: Place rubber bands at the bottom of the hemorrhoids to make them fall off. Put medicine into the hemorrhoids to shrink them. Shine a type of light on the hemorrhoids to cause them to fall off. Do surgery to get rid of the hemorrhoids. Follow these instructions at home: Medicines Take over-the-counter and prescription medicines only as told by your doctor. Use creams with medicine in them or medicines that you put in your butt as told by your doctor. Eating and drinking  Eat foods that have a lot of fiber in them. These include whole grains, beans, nuts, fruits, and vegetables. Ask your doctor about taking products that have fiber added to them (fibersupplements). Take in less fat. You can do this by: Eating low-fat dairy products. Eating less red meat. Staying away from processed foods. Drink enough fluid to keep  your pee (urine) pale yellow. Managing pain and swelling  Take a warm-water bath (sitz bath) for 20 minutes to ease pain. Do this 3-4 times a day. You may do this in a bathtub. You may also use a portable sitz bath that fits over the toilet. If told, put ice on the painful area. It may help to use ice between your warm baths. Put ice in a plastic bag. Place a towel between your skin and the bag. Leave the ice on for 20 minutes, 2-3 times a day. If your skin turns bright red, take off the ice right away to prevent skin damage. The risk of damage is higher if you cannot feel pain, heat, or cold. General instructions Exercise. Ask your doctor how much and what kind of exercise is best for you. Go to the bathroom when you need to poop. Do not wait. Try not to push too hard when you poop. Keep your butt dry and clean. Use wet toilet paper or moist towelettes after you poop. Do not sit on the toilet for a long time. Contact a doctor if: You have pain and swelling that do not get better with treatment. You have trouble pooping. You cannot poop. You have pain or swelling outside the area of the hemorrhoids. Get help right away if: You have bleeding from the butt that will not stop. This information is not intended to replace advice given to you by your health care provider. Make sure you discuss any questions you have with your health care provider. Document Revised: 07/16/2022  Document Reviewed: 07/16/2022 Elsevier Patient Education  2024 [employer].

## 2024-10-25 ENCOUNTER — Encounter: Payer: Self-pay | Admitting: Surgery

## 2024-10-25 NOTE — Progress Notes (Signed)
 10/24/2024  Reason for Visit: Enlarged internal/external hemorrhoids  History of Present Illness: Jeremiah Brock is a 53 y.o. male presenting for evaluation of enlarged and tender internal/external hemorrhoids.  The patient has a history of this in the past and multiple episodes of flareups.  These usually go down on their own.  However he reports that this past week, he started having worsening perianal pain and this was not letting down.  He presented to the emergency room on 10/22/2024 and was diagnosed with thrombosed external hemorrhoid.  He underwent incision to evacuate clot but this was very difficult due to the inflammation and the size of the hemorrhoids.  The patient reports that this was very tender overall and took many hours.  He was recommended to contact our office to set up an appointment to consider surgery for hemorrhoidectomy.  The patient reports that his pain has continued to worsen despite other medications given he walked into our office today in the hopes to be seen.  Denies any fevers, chills, chest pain, shortness of breath.  Reports only trying to drink some liquids due to concerns for having bowel movements due to the pain.  He has been doing sitz bath's or showers and these have been helping.  However he is still in a lot of pain.  He has noted some bleeding but no significant amount of blood.  Past Medical History: History reviewed. No pertinent past medical history.   Past Surgical History: History reviewed. No pertinent surgical history.  Home Medications: Prior to Admission medications   Medication Sig Start Date End Date Taking? Authorizing Provider  atorvastatin (LIPITOR) 20 MG tablet Take 20 mg by mouth daily.   Yes [provider]  empagliflozin (JARDIANCE) 10 MG TABS tablet Take by mouth daily.   Yes [provider]  fenofibrate (TRICOR) 145 MG tablet Take 145 mg by mouth daily.   Yes [provider]  gabapentin (NEURONTIN) 300  MG capsule Take 300 mg by mouth 3 (three) times daily.   Yes [provider]  hydrocortisone  (ANUSOL -HC) 2.5 % rectal cream Place 1 Application rectally 2 (two) times daily for 14 days. 10/24/24 11/07/24 Yes Mathew Postiglione, [name], MD  lidocaine  (LMX) 4 % cream Apply 1 Application topically as needed.   Yes [provider]  lidocaine  (XYLOCAINE ) 5 % ointment Apply 1 Application topically as needed. 10/24/24  Yes Kalisa Girtman, [name], MD  losartan (COZAAR) 50 MG tablet Take 50 mg by mouth daily.   Yes [provider]  metFORMIN (GLUCOPHAGE) 1000 MG tablet Take 1,000 mg by mouth 2 (two) times daily with a meal.   Yes [provider]  methocarbamol (ROBAXIN) 500 MG tablet Take 500 mg by mouth 4 (four) times daily.   Yes [provider]  oxyCODONE  (ROXICODONE ) 5 MG immediate release tablet Take 1 tablet (5 mg total) by mouth every 6 (six) hours as needed for severe pain (pain score 7-10). 10/24/24 10/24/25 Yes Amoreena Neubert, [name], MD  valACYclovir (VALTREX) 1000 MG tablet Take 1,000 mg by mouth 2 (two) times daily.   Yes [provider]  hydrocortisone  (ANUSOL -HC) 25 MG suppository Place 1 suppository (25 mg total) rectally every 12 (twelve) hours for 7 days. 10/22/24 10/29/24  Viviann Pastor, MD  oxyCODONE -acetaminophen  (PERCOCET) 5-325 MG tablet Take 2 tablets by mouth every 4 (four) hours as needed for up to 3 days for severe pain (pain score 7-10). 10/22/24 10/25/24  Viviann Pastor, MD  predniSONE  (STERAPRED UNI-PAK 21 TAB) 10 MG (21) TBPK tablet  Take by mouth daily. Take 6 tabs by mouth day 1, then 5 tabs, then 4 tabs, then 3 tabs, 2 tabs, then 1 tab for the last day 10/07/19   Babara, Amy V, PA-C  senna-docusate (SENOKOT-S) 8.6-50 MG tablet Take 2 tablets by mouth 2 (two) times daily. 10/22/24   Viviann Pastor, MD    Allergies: Allergies  Allergen Reactions   Bee Venom     Social History:  reports that he has never smoked. He has never used smokeless tobacco. He  reports that he does not drink alcohol and does not use drugs.   Family History: History reviewed. No pertinent family history.  Review of Systems: Review of Systems  Constitutional:  Negative for chills and fever.  Respiratory:  Negative for shortness of breath.   Cardiovascular:  Negative for chest pain.  Gastrointestinal:  Positive for blood in stool and constipation. Negative for nausea and vomiting.       Significant perianal pain    Physical Exam BP 129/85   Pulse 98   Ht 6' 3 (1.905 m)   Wt 234 lb (106.1 kg)   SpO2 98%   BMI 29.25 kg/m  CONSTITUTIONAL: Appears to be in some pain distress. HEENT:  Normocephalic, atraumatic, extraocular motion intact. RESPIRATORY:  Normal respiratory effort without pathologic use of accessory muscles. CARDIOVASCULAR: Regular rhythm and rate. RECTAL: External exam reveals enlarged and very inflamed external hemorrhoids of all 3 columns.  There is evidence of some bleeding on his underwear and it appears that the internal hemorrhoids are also inflamed.  The incisions from emergency room appear to be healing but everything is very inflamed.  No evidence of thrombosis at this point.  Digital rectal exam was not done today due to the patient's pain. MUSCULOSKELETAL:  Normal muscle strength and tone in all four extremities.  No peripheral edema or cyanosis. NEUROLOGIC:  Motor and sensation is grossly normal.  Cranial nerves are grossly intact. PSYCH:  Alert and oriented to person, place and time. Affect is normal.  Laboratory Analysis: Labs from 10/22/2024: Sodium 131, potassium 4.4, chloride 96, CO2 22, BUN 18, creatinine 0.89.  WBC 17.6, hemoglobin 14.9, hematocrit 48.7, platelets 256.  Imaging: No results found.  Assessment and Plan: This is a 53 y.o. male with inflamed and enlarged internal and external hemorrhoids.  - Discussed with patient the findings on exam.  I do not see any concrete evidence of thrombosis but the hemorrhoids are  very inflamed at this point.  Likely the attempted I&D only increase the pain as I do not see any true clot or evidence of thrombosis.  Discussed with patient at this point the main treatment is symptomatic.  We will give her a prescription for initial ointment so that he can apply directly to the enlarged areas.  I think a suppository would be too hard to try to insert or maintain.  Will also give him a prescription for lidocaine  ointment to help with some of the pain in that area.  Will also give him a prescription for oxycodone  and discussed that he can also take Tylenol .  He does have a history of bleeding ulcers and so he is not allowed to take ibuprofen .  Discussed with him the importance also of working on constipation issues and he will start taking MiraLAX or stool softeners daily.  Also recommended him to continue sitz bath's or showers to help sooth the inflamed tissue in the perianal region. - Discussed with him that the eventual goal would  be to do hemorrhoidectomy in order to get rid of all the excess and large tissues.  However we have to wait for all of this tissue inflammation to subside. - Patient will follow-up with me in 3 weeks to reassess his progress.  I spent 30 minutes dedicated to the care of this patient on the date of this encounter to include pre-visit review of records, face-to-face time with the patient discussing diagnosis and management, and any post-visit coordination of care.   [name] Sheree Plant, MD [facility]

## 2024-10-31 ENCOUNTER — Ambulatory Visit: Payer: Self-pay | Admitting: Surgery

## 2024-11-01 ENCOUNTER — Telehealth: Payer: Self-pay | Admitting: Surgery

## 2024-11-01 NOTE — Telephone Encounter (Signed)
 Patient was seen in the ED for hemorrhoids.  He has another follow up visit in office with Dr. Desiderio 11/14/24.  Patient needs and requesting the following:  First, needs a work note stating that he needs to remain out of work until his follow on 11/14/24.  He is a driver, driving long distant.   Second, requesting a refill on his Oxycodone .  States still having a lot of pain.  He uses [facility] in [place].   Please call patient to advise and let know when requests are ready.  Thank you.

## 2024-11-07 ENCOUNTER — Other Ambulatory Visit: Payer: Self-pay | Admitting: Surgery

## 2024-11-07 DIAGNOSIS — K644 Residual hemorrhoidal skin tags: Secondary | ICD-10-CM

## 2024-11-09 ENCOUNTER — Other Ambulatory Visit: Payer: Self-pay

## 2024-11-09 DIAGNOSIS — K644 Residual hemorrhoidal skin tags: Secondary | ICD-10-CM

## 2024-11-09 MED ORDER — LIDOCAINE 5 % EX OINT: 1.0000 | TOPICAL_OINTMENT | CUTANEOUS | 0 refills | Status: DC | PRN

## 2024-11-09 NOTE — Progress Notes (Signed)
 Assessment   53 y.o. male with  Chief Complaint  Patient presents with   New Patient    Back pain   , consistent with 1. Lumbar radiculopathy  gabapentin (NEURONTIN) 600 mg tablet   oxyCODONE -acetaminophen  (PERCOCET,ENDOCET) 5-325 mg per tablet       Plan   Patient presents with what seems like a left L5 radiculopathy.  Will plan for left L4-5 L5-S1 TFESI in C arm.  Patient is not on any blood thinning medications.  Currently has active shingles and has been treated for it.  I informed the patient that if his shingles is still present at the time of his injection Dr. Rosella will not proceed with interventional therapy. He voiced understanding.   Will increase gabapentin to 600 mg 4 times a day.  I sent in Percocet 5 mg #28 tablets to help palliate some of his pain symptoms.  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  I informed the patient that we do not place patients on any work restrictions or take patients out of work.  Plan for physical therapy after his pain is under better control as I do not think he is able to do physical therapy at this time secondary to his pain.  Considering the chronicity of his pain will obtain MRI lumbar spine.  He is looking more for a fix.   Depending on the results of his MRI will refer to [facility] for surgical evaluation.    Follow up for left L4-5, L5-S1 TFESI with Dr. Rosella.     Subjective   History of Present Illness:  Patient was referred to [facility] by Donnamarie Laine Pike, PA-C  History of Present Illness The patient presents for evaluation of lower back pain radiating to the left leg.  He reports experiencing lower back pain that radiates down his left leg, which began approximately 5 weeks ago. This is acute on chronic pain, noting he has been dealing with this for year, noting episodes of increase pain. He is wanting a more permanent fix now.      The pain is described as shooting in nature, extending from his lower back to his knee and foot. He has a history of degenerative disc disease in his lower back and has been intermittently using prednisone  for several years. The pain is present regardless of whether he is standing, walking, sitting, or lying down. He has been managing the pain with gabapentin, taking 3 to 4 doses of 300 mg daily, which provides some relief. Additionally, he applies lidocaine  topically to alleviate nerve pain and aid sleep.  He also mentions a previous episode of shingles on the left side, which was treated with Valtrex 1000 mg for 10 days. He has been experiencing significant skin pain and nerve discomfort in his legs, which he attributes to a possible issue with his sciatic nerve. He has been to the emergency room three times due to his symptoms. He was prescribed gabapentin and oxycodone  for a short period, which provided relief, but he is currently not receiving any pain medication.  He is currently unable to work due to his condition and is seeking a note for work absence for the next 3 to 4 weeks. He is on short-term disability and does not plan to return to work until his condition improves. 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  He has previously undergone spinal decompression therapy with a chiropractor for 2 to 3 months but discontinued it following an accident that resulted in a broken leg in three places, requiring the insertion of metal screws.  PAST SURGICAL HISTORY: He had an ankle fracture last summer, which was treated by a trauma surgeon at [facility]. They put a metal lead through the middle and rods and screws on both sides. He can bend it, but it is painful.  SOCIAL HISTORY He works as a naval architect.    Pain rating: 10 out of 10  Physical Therapy/Home Exercise:  none recently  - Anticoagulants: none   Imaging (images  independently reviewed/interpreted by me and read reviewed):  Results for orders placed during the hospital encounter of 10/12/24  XR Spine  Lumbar 2-3 Views  Narrative INDICATION: Lower Back Pain. TECHNIQUE:  Impression XR SPINE LUMBAR 2-3 VIEWS 10/12/2024 11:34 AM COMPARISON: None.  FINDINGS: No acute fracture or destructive osseous lesions. Moderate rightward apex curvature of the lumbar spine centered at T12-L1. Trace grade 1 anterolisthesis of L5 on S1. The vertebral body heights are maintained. Mild multilevel intervertebral disc height loss.   IMPRESSION: 1.  No acute osseous abnormality.  Electronically Signed by: Valma Companion, MD on 10/12/2024 12:04 PM   MRI lumbar  spine 2016  FINDINGS:  Last fully open disk space is labeled L5-S1. Present examination  incorporates from T11-12 disc space through S3. Conus L1 level.   Visualized paravertebral structures unremarkable.   T11-12 through L3-4:  Negative.   L4-5: Mild disc degeneration. Bulge with small central protrusion.  No significant spinal stenosis or nerve root compression.   L5-S1: Mild disc degeneration and endplate reactive changes.  Dysplastic posterior elements. Bilateral L5 pars defects. Bulge  without spinal stenosis. Minimal spur right foraminal/ lateral  position without compression of exiting right L5 nerve root.   IMPRESSION:  L4-5 bulge with small central protrusion. No significant spinal  stenosis or nerve root compression.   L5-S1 mild disc degeneration and endplate reactive changes.  Dysplastic posterior elements. Bilateral L5 pars defects. Bulge  without spinal stenosis. Minimal spur right foraminal/ lateral  position without compression of exiting right L5 nerve root.    REVIEW OF SYSTEMS:    Review of Systems  Musculoskeletal:  Positive for arthralgias and back pain.    Allergic/Immunologic: positive for none, negative for anaphylaxis and angioedema  Denies new numbness  or weakness Denies current chest pain, SOB, Oversedation     Health Status  Allergies: Allergies[1]  Current medications: Medication List reviewed and updated today. Current Home Medications  Medication Sig Last Dose  atorvastatin (LIPITOR) 20 mg tablet Take one tablet (20 mg dose) by mouth daily.   fenofibrate 145 MG tablet Take one tablet (145 mg dose) by mouth daily.   gabapentin (NEURONTIN) 600 mg tablet Take one tablet (600 mg dose) by mouth 4 (four) times daily.   oxyCODONE -acetaminophen  (PERCOCET,ENDOCET) 5-325 mg per tablet Take one tablet by mouth every 6 (six) hours as needed for Pain for up to 7 days. Max Daily Amount: 4 tablets   valacyclovir (VALTREX) 1000 mg tablet Take one tablet (1,000 mg dose) by mouth 3 (three) times a day for 10 days.       Problem list: Problem list reviewed and updated at today's visit There is no problem list on file for this patient.[2]   Histories  Family History: family history was reviewed and updated today's visit, . Family History[3]  Social History: Social history was reviewed and updated today's visit.       Objective   Vitals:   11/09/24 0958  BP: (!) 144/97  Pulse: 85  Weight: 244 lb (110.7 kg)   Body mass index is 30.5 kg/m.  Physical Exam:  GENERAL/PSYCHIATRIC:  Patient is alert, oriented, answers questions appropriately. In no apparent distress. Interpersonal behavior socially appropriate. Mood appropriate to interview. HEENT:  Normocepalic, conjuctiva clear, mucous membranes moist SKIN:  warm and dry. CARDIOVASCULAR:  Well perfused. No evidence of cyanosis/edema.   RESPIRATORY:  Respirations symetrical and without exertion. GAIT: stiff-legged BACK:   Facet loading maneuvers are positive bilaterally. Negative SLR to the left, negative contralateral  Motor Exam by Level              LEFT               RIGHT   L2/3     Hip Flexion                  5/5                   5/5 L3/4     Knee Flex/Ext              5/5                    5/5 L4        ADF                             5/5                   5/5 L5        EHL                             5/5                   5/5 S1        APF/Eversion              5/5                   5/5   Sensory Exam by Level          Left                  Right   L2                                            Normal             Normal L3                                            Normal             Normal L4                                            Normal  Normal L5                                            Normal             Normal S1                                            Normal             Normal   Jeremiah Alice, NP November 09, 2024                     ..             [1] Allergies Allergen Reactions   Nsaids Other    PCP told him not to to prevent bleeding.   [2] There is no problem list on file for this patient.  [3] No family history on file.

## 2024-11-14 ENCOUNTER — Ambulatory Visit: Admitting: Surgery

## 2024-11-14 ENCOUNTER — Encounter: Payer: Self-pay | Admitting: Surgery

## 2024-11-14 VITALS — BP 147/91 | HR 92 | Ht 75.0 in | Wt 228.0 lb

## 2024-11-14 DIAGNOSIS — K644 Residual hemorrhoidal skin tags: Secondary | ICD-10-CM | POA: Diagnosis not present

## 2024-11-14 NOTE — Patient Instructions (Addendum)
 Please call the office if you have any questions or concerns  We will see you back in 3 weeks

## 2024-11-14 NOTE — Progress Notes (Signed)
 " 11/14/2024  History of Present Illness: Jeremiah Brock is a 53 y.o. male presenting for follow-up of inflamed external hemorrhoids.  He was last seen on 10/24/2024 at which time the external hemorrhoids were very inflamed and were very swollen and tender and firm.  No evidence of thrombosis so no other procedures were needed.  He was treated with Anusol  ointment, lidocaine  ointment, and oxycodone  for pain.  He reports that he has been doing much better and it feels like night and day difference today compared to 3 weeks ago.  Denies any bleeding and has been trying to keep up with his constipation.  Separately, the patient recently had a bout of shingles and is still having some residual pain that shoots down to his left leg.  Past Medical History: Past Medical History:  Diagnosis Date   Diabetes mellitus without complication (HCC)    Hypertension    Shingles 2025     Past Surgical History: History reviewed. No pertinent surgical history.  Home Medications: Prior to Admission medications  Medication Sig Start Date End Date Taking? Authorizing Provider  polyethylene glycol powder (GLYCOLAX/MIRALAX) 17 GM/SCOOP powder Take 17 g by mouth daily. Dissolve 1 capful (17g) in 4-8 ounces of liquid and take by mouth daily.   Yes [provider]  atorvastatin (LIPITOR) 20 MG tablet Take 20 mg by mouth daily.    [provider]  empagliflozin (JARDIANCE) 10 MG TABS tablet Take by mouth daily.    [provider]  fenofibrate (TRICOR) 145 MG tablet Take 145 mg by mouth daily.    [provider]  gabapentin (NEURONTIN) 300 MG capsule Take 300 mg by mouth 3 (three) times daily.    [provider]  lidocaine  (LMX) 4 % cream Apply 1 Application topically as needed.    [provider]  lidocaine  (XYLOCAINE ) 5 % ointment Apply 1 Application topically as needed. 11/09/24   Jordane Hisle, MD  losartan (COZAAR) 50 MG tablet Take 50 mg by mouth daily.     [provider]  metFORMIN (GLUCOPHAGE) 1000 MG tablet Take 1,000 mg by mouth 2 (two) times daily with a meal.    [provider]  methocarbamol (ROBAXIN) 500 MG tablet Take 500 mg by mouth 4 (four) times daily.    [provider]  oxyCODONE  (ROXICODONE ) 5 MG immediate release tablet Take 1 tablet (5 mg total) by mouth every 6 (six) hours as needed for severe pain (pain score 7-10). 10/24/24 10/24/25  Desiderio Schanz, MD  predniSONE  (STERAPRED UNI-PAK 21 TAB) 10 MG (21) TBPK tablet Take by mouth daily. Take 6 tabs by mouth day 1, then 5 tabs, then 4 tabs, then 3 tabs, 2 tabs, then 1 tab for the last day 10/07/19   Babara, Amy V, PA-C  senna-docusate (SENOKOT-S) 8.6-50 MG tablet Take 2 tablets by mouth 2 (two) times daily. 10/22/24   Viviann Pastor, MD  valACYclovir (VALTREX) 1000 MG tablet Take 1,000 mg by mouth 2 (two) times daily. Patient not taking: Reported on 11/14/2024    [provider]    Allergies: Allergies[1]  Review of Systems: Review of Systems  Constitutional:  Negative for chills and fever.  Respiratory:  Negative for shortness of breath.   Cardiovascular:  Negative for chest pain.  Gastrointestinal:  Positive for constipation. Negative for blood in stool, nausea and vomiting.    Physical Exam BP (!) 147/91   Pulse 92   Ht 6' 3 (1.905 m)   Wt 228 lb (103.4  kg)   SpO2 98%   BMI 28.50 kg/m  CONSTITUTIONAL: No acute distress HEENT:  Normocephalic, atraumatic, extraocular motion intact. RESPIRATORY:  Normal respiratory effort without pathologic use of accessory muscles. CARDIOVASCULAR: Regular rhythm and rate RECTAL: External exam reveals a much improved external hemorrhoids throughout feeling soft without significant swelling or induration.  There is no fissures noted.  Digital rectal exam was able to get done today although he still having some tenderness on that.  There is evidence of enlarged internal hemorrhoids but no gross  bleeding. NEUROLOGIC:  Motor and sensation is grossly normal.  Cranial nerves are grossly intact. PSYCH:  Alert and oriented to person, place and time. Affect is normal.   Assessment and Plan: This is a 53 y.o. male with inflamed external hemorrhoids.  - The patient has improved significantly compared to his last visit.  There are still some residual tenderness on exam today but otherwise the tissues are much softer and less swollen compared to before.  Discussed with him that I would like to continue more with maintenance plan and working on constipation issues to prevent further issues with his hemorrhoids.  At this point he does not necessarily need to do more sitz bath's or using the Anusol  or lidocaine  but the main thing will be working on constipation.  Discussed with him that he can take MiraLAX daily and either Colace or senna daily to control his constipation. - Patient will follow-up with me in 3 more weeks to make sure all the tissues are back to normal size and with no tenderness.  If that is the case then we will be able to schedule him for surgery for exam under anesthesia and hemorrhoidectomy. - All of his questions have been answered.  I spent 20 minutes dedicated to the care of this patient on the date of this encounter to include pre-visit review of records, face-to-face time with the patient discussing diagnosis and management, and any post-visit coordination of care.   Jeremiah Sheree Plant, MD [facility]        [1]  Allergies Allergen Reactions   Bee Venom    "

## 2024-12-07 ENCOUNTER — Encounter: Payer: Self-pay | Admitting: Surgery

## 2024-12-07 ENCOUNTER — Telehealth: Payer: Self-pay | Admitting: Surgery

## 2024-12-07 ENCOUNTER — Ambulatory Visit: Admitting: Surgery

## 2024-12-07 VITALS — BP 139/80 | HR 102 | Ht 75.0 in | Wt 233.0 lb

## 2024-12-07 DIAGNOSIS — K648 Other hemorrhoids: Secondary | ICD-10-CM | POA: Diagnosis not present

## 2024-12-07 DIAGNOSIS — K644 Residual hemorrhoidal skin tags: Secondary | ICD-10-CM | POA: Diagnosis not present

## 2024-12-07 NOTE — H&P (View-Only) (Signed)
 " 12/07/2024  History of Present Illness: Jeremiah Brock is a 54 y.o. male presenting for follow-up of internal/external hemorrhoids.  He was initially seen on 10/24/2024 with very painful inflamed external hemorrhoids.  He has been successfully treated conservatively with Anusol  ointment, lidocaine  ointment, as well as pain medication and bowel regimen.  He presents today for follow-up.  He reports that the pain is now fully resolved and there is no more swelling or inflammation.  He denies any bleeding.  He is very interested in surgical management to prevent these episodes from happening again.  Past Medical History: Past Medical History:  Diagnosis Date   Diabetes mellitus without complication (HCC)    Hypertension    Shingles 2025     Past Surgical History: No past surgical history on file.  Home Medications: Prior to Admission medications  Medication Sig Start Date End Date Taking? Authorizing Provider  atorvastatin (LIPITOR) 20 MG tablet Take 20 mg by mouth daily.   Yes [provider]  empagliflozin (JARDIANCE) 10 MG TABS tablet Take by mouth daily.   Yes [provider]  fenofibrate (TRICOR) 145 MG tablet Take 145 mg by mouth daily.   Yes [provider]  gabapentin (NEURONTIN) 300 MG capsule Take 300 mg by mouth 3 (three) times daily.   Yes [provider]  lidocaine  (LMX) 4 % cream Apply 1 Application topically as needed.   Yes [provider]  lidocaine  (XYLOCAINE ) 5 % ointment Apply 1 Application topically as needed. 11/09/24  Yes [name], [name], MD  losartan (COZAAR) 50 MG tablet Take 50 mg by mouth daily.   Yes [provider]  metFORMIN (GLUCOPHAGE) 1000 MG tablet Take 1,000 mg by mouth 2 (two) times daily with a meal.   Yes [provider]  oxyCODONE  (ROXICODONE ) 5 MG immediate release tablet Take 1 tablet (5 mg total) by mouth every 6 (six) hours as needed for severe pain (pain score 7-10). 10/24/24 10/24/25 Yes  [name], [name], MD  polyethylene glycol powder (GLYCOLAX/MIRALAX) 17 GM/SCOOP powder Take 17 g by mouth daily. Dissolve 1 capful (17g) in 4-8 ounces of liquid and take by mouth daily.   Yes [provider]  valACYclovir (VALTREX) 1000 MG tablet Take 1,000 mg by mouth 2 (two) times daily.   Yes [provider]  methocarbamol (ROBAXIN) 500 MG tablet Take 500 mg by mouth 4 (four) times daily. Patient not taking: Reported on 12/07/2024    [provider]    Allergies: Allergies[1]  Review of Systems: Review of Systems  Constitutional:  Negative for chills and fever.  HENT:  Negative for hearing loss.   Respiratory:  Negative for shortness of breath.   Cardiovascular:  Negative for chest pain.  Gastrointestinal:  Negative for blood in stool, constipation, nausea and vomiting.  Skin:  Negative for rash.    Physical Exam BP 139/80   Pulse (!) 102   Ht 6' 3 (1.905 m)   Wt 233 lb (105.7 kg)   SpO2 98%   BMI 29.12 kg/m  CONSTITUTIONAL: No acute distress HEENT:  Normocephalic, atraumatic, extraocular motion intact. RESPIRATORY:  Lungs are clear, and breath sounds are equal bilaterally. Normal respiratory effort without pathologic use of accessory muscles. CARDIOVASCULAR: Heart is regular without murmurs, gallops, or rubs. RECTAL: External exam reveals mildly enlarged right posterior and left lateral external components with minimally enlarged right anterior portion.  The tissues are not inflamed without any significant swelling or tenderness on palpation.  Digital rectal exam also reveals  enlarged internal components of the left lateral and right posterior columns.  No gross blood noted.  No fissures or any other injuries. NEUROLOGIC:  Motor and sensation is grossly normal.  Cranial nerves are grossly intact. PSYCH:  Alert and oriented to person, place and time. Affect is normal.  Labs/Imaging: Labs on 10/22/2024: Sodium 131, potassium 4.4, chloride 96, CO2 22, BUN  18, creatinine 0.89.  WBC 17.6, hemoglobin 14.9, hematocrit 48.7, platelets 256.  Assessment and Plan: This is a 54 y.o. male with internal/external hemorrhoids.  - The patient is fully recovered from his episode last month of pretty significant severe flareup of the external hemorrhoids.  The patient denies any further pain or swelling and denies any further bleeding.  Patient would like to prevent this episode from happening again and is interested in surgical management. - Discussed with patient the plan for exam under anesthesia and hemorrhoidectomy of the right posterior and left lateral columns.  Discussed with him the rationale for only removing 2 out of the 3 columns to prevent any significant anal canal stenosis.  Reviewed with him the surgery at length including the planned incisions, risks of bleeding, infection, injury to surrounding structures, that this would be an outpatient procedure, postoperative activity restrictions, pain control, and he is willing to proceed. - We will schedule the patient for surgery on 12/22/2024.  All of his questions have been answered.  I spent 30 minutes dedicated to the care of this patient on the date of this encounter to include pre-visit review of records, face-to-face time with the patient discussing diagnosis and management, and any post-visit coordination of care.   [name] Sheree Plant, MD [facility]         [1]  Allergies Allergen Reactions   Bee Venom    "

## 2024-12-07 NOTE — Telephone Encounter (Signed)
 Patient has been advised of Pre-Admission date/time, and Surgery date at [facility].  Surgery Date: 12/22/24 Preadmission Testing Date: 12/19/24 (phone 8a-1p)  Patient informed of the scheduling process and surgery information given at time of office visit.   Patient has been made aware to call [phone], between 1-3:00pm the day before surgery, to find out what time to arrive for surgery.

## 2024-12-07 NOTE — Progress Notes (Signed)
 " 12/07/2024  History of Present Illness: Jeremiah Brock is a 54 y.o. male presenting for follow-up of internal/external hemorrhoids.  He was initially seen on 10/24/2024 with very painful inflamed external hemorrhoids.  He has been successfully treated conservatively with Anusol  ointment, lidocaine  ointment, as well as pain medication and bowel regimen.  He presents today for follow-up.  He reports that the pain is now fully resolved and there is no more swelling or inflammation.  He denies any bleeding.  He is very interested in surgical management to prevent these episodes from happening again.  Past Medical History: Past Medical History:  Diagnosis Date   Diabetes mellitus without complication (HCC)    Hypertension    Shingles 2025     Past Surgical History: No past surgical history on file.  Home Medications: Prior to Admission medications  Medication Sig Start Date End Date Taking? Authorizing Provider  atorvastatin (LIPITOR) 20 MG tablet Take 20 mg by mouth daily.   Yes [provider]  empagliflozin (JARDIANCE) 10 MG TABS tablet Take by mouth daily.   Yes [provider]  fenofibrate (TRICOR) 145 MG tablet Take 145 mg by mouth daily.   Yes [provider]  gabapentin (NEURONTIN) 300 MG capsule Take 300 mg by mouth 3 (three) times daily.   Yes [provider]  lidocaine  (LMX) 4 % cream Apply 1 Application topically as needed.   Yes [provider]  lidocaine  (XYLOCAINE ) 5 % ointment Apply 1 Application topically as needed. 11/09/24  Yes Jeremiah Brock, [name], MD  losartan (COZAAR) 50 MG tablet Take 50 mg by mouth daily.   Yes [provider]  metFORMIN (GLUCOPHAGE) 1000 MG tablet Take 1,000 mg by mouth 2 (two) times daily with a meal.   Yes [provider]  oxyCODONE  (ROXICODONE ) 5 MG immediate release tablet Take 1 tablet (5 mg total) by mouth every 6 (six) hours as needed for severe pain (pain score 7-10). 10/24/24 10/24/25 Yes  [name], [name], MD  polyethylene glycol powder (GLYCOLAX/MIRALAX) 17 GM/SCOOP powder Take 17 g by mouth daily. Dissolve 1 capful (17g) in 4-8 ounces of liquid and take by mouth daily.   Yes [provider]  valACYclovir (VALTREX) 1000 MG tablet Take 1,000 mg by mouth 2 (two) times daily.   Yes [provider]  methocarbamol (ROBAXIN) 500 MG tablet Take 500 mg by mouth 4 (four) times daily. Patient not taking: Reported on 12/07/2024    [provider]    Allergies: Allergies[1]  Review of Systems: Review of Systems  Constitutional:  Negative for chills and fever.  HENT:  Negative for hearing loss.   Respiratory:  Negative for shortness of breath.   Cardiovascular:  Negative for chest pain.  Gastrointestinal:  Negative for blood in stool, constipation, nausea and vomiting.  Skin:  Negative for rash.    Physical Exam BP 139/80   Pulse (!) 102   Ht 6' 3 (1.905 m)   Wt 233 lb (105.7 kg)   SpO2 98%   BMI 29.12 kg/m  CONSTITUTIONAL: No acute distress HEENT:  Normocephalic, atraumatic, extraocular motion intact. RESPIRATORY:  Lungs are clear, and breath sounds are equal bilaterally. Normal respiratory effort without pathologic use of accessory muscles. CARDIOVASCULAR: Heart is regular without murmurs, gallops, or rubs. RECTAL: External exam reveals mildly enlarged right posterior and left lateral external components with minimally enlarged right anterior portion.  The tissues are not inflamed without any significant swelling or tenderness on palpation.  Digital rectal exam also reveals  enlarged internal components of the left lateral and right posterior columns.  No gross blood noted.  No fissures or any other injuries. NEUROLOGIC:  Motor and sensation is grossly normal.  Cranial nerves are grossly intact. PSYCH:  Alert and oriented to person, place and time. Affect is normal.  Labs/Imaging: Labs on 10/22/2024: Sodium 131, potassium 4.4, chloride 96, CO2 22, BUN  18, creatinine 0.89.  WBC 17.6, hemoglobin 14.9, hematocrit 48.7, platelets 256.  Assessment and Plan: This is a 54 y.o. male with internal/external hemorrhoids.  - The patient is fully recovered from his episode last month of pretty significant severe flareup of the external hemorrhoids.  The patient denies any further pain or swelling and denies any further bleeding.  Patient would like to prevent this episode from happening again and is interested in surgical management. - Discussed with patient the plan for exam under anesthesia and hemorrhoidectomy of the right posterior and left lateral columns.  Discussed with him the rationale for only removing 2 out of the 3 columns to prevent any significant anal canal stenosis.  Reviewed with him the surgery at length including the planned incisions, risks of bleeding, infection, injury to surrounding structures, that this would be an outpatient procedure, postoperative activity restrictions, pain control, and he is willing to proceed. - We will schedule the patient for surgery on 12/22/2024.  All of his questions have been answered.  I spent 30 minutes dedicated to the care of this patient on the date of this encounter to include pre-visit review of records, face-to-face time with the patient discussing diagnosis and management, and any post-visit coordination of care.   Jeremiah Brock Sheree Plant, MD [facility]         [1]  Allergies Allergen Reactions   Bee Venom    "

## 2024-12-07 NOTE — Patient Instructions (Addendum)
 You have requested to have Hemorrhoid surgery today. This will be scheduled at [facility] with Dr Aleen Campi.  Please review the information below and your [facility] Information. Our surgery scheduler will call you to review surgery date and to go over information.  If you have FMLA or disability paperwork that needs filled out you may drop this off at our office or this can be faxed to [phone].  You will be required to do 2 enemas prior to your surgery. The first will be the night prior and the second will be done the morning of surgery.  Constipation is going to be your biggest obstacle following surgery. Use all stool softeners and laxatives as prescribed after your surgery and be sure to drink 72 ounces of water or more every day. This will help to avoid constipation. If you do all of this and you are still having difficulty, please call our office for further instructions as soon as you begin to have difficulty with bowel movements.  You may want to buy a disposable Sitz bath prior to surgery to aid in pain and cleanliness after surgery. The information on how to do use a disposable sitz bath or using a bath tub are below.  You will be out of work 1-2 weeks depending on how your healing goes. If you have FMLA/Disability paperwork that needs filled out you may drop this off at the office or fax it to [phone].   Hemorrhoid Surgery After Care Refer to this sheet in the next few weeks. These instructions provide you with information about caring for yourself after your procedure. Your health care provider may also give you more specific instructions. Your treatment has been planned according to current medical practices, but problems sometimes occur. Call your health care provider if you have any problems or questions after your procedure. What can I expect after the procedure? After the procedure, it is common to have: Rectal pain. Pain when you are having a bowel movement. Slight  rectal bleeding.  Follow these instructions at home: Medicines Take over-the-counter and prescription medicines only as told by your health care provider. Do not drive or operate heavy machinery while taking prescription pain medicine. Use a stool softener or a bulk laxative as told by your health care provider. Activity Rest at home. Return to your normal activities as told by your health care provider. Do not lift anything that is heavier than 10 lb (4.5 kg). Do not sit for long periods of time. Take a walk every day or as told by your health care provider. Do not strain to have a bowel movement. Do not spend a long time sitting on the toilet. Eating and drinking Eat foods that contain fiber, such as whole grains, beans, nuts, fruits, and vegetables. Drink enough fluid to keep your urine clear or pale yellow. General instructions Sit in a warm bath 2-3 times per day to relieve soreness or itching. Keep all follow-up visits as told by your health care provider. This is important. Contact a health care provider if: Your pain medicine is not helping. You have a fever or chills. You become constipated. You have trouble passing urine. Get help right away if: You have very bad rectal pain. You have heavy bleeding from your rectum. This information is not intended to replace advice given to you by your health care provider. Make sure you discuss any questions you have with your health care provider. Document Released: 01/24/2004 Document Revised: 04/10/2016 Document Reviewed: 01/29/2015 Elsevier Interactive  Patient Education  2018 [employer]   Disposable Sitz Bath A disposable sitz bath is a plastic basin that fits over the toilet. A bag is hung above the toilet, and the bag is connected to a tube that opens into the basin. The bag is filled with warm water that flows into the basin through the tube. A sitz bath can be used to help relieve symptoms, clean, and promote healing in the  genital and anal areas, as well as in the lower abdomen and buttocks. What are the risks? Sitz baths are generally very safe. It is possible for the skin between the genitals and the anus (perineum) to become infected, but this is rare. You can avoid this by cleaning your sitz bath supplies thoroughly. How to use a disposable sitz bath Close the clamp on the tube. Make sure the clamp is closed tightly to prevent leakage. Fill the sitz bath basin and the plastic bag with warm water. The water should be warm enough to be comfortable, but not hot. Raise the toilet seat and place the filled basin on the toilet. Make sure the overflow opening is facing toward the back of the toilet. If you prefer, you may place the empty basin on the toilet first, and then use the plastic bag to fill the basin with warm water. Hang the filled plastic bag overhead on a hook or towel rack close to the toilet. The bag should be higher than the toilet so that the water will flow down through the tube. Attach the tube to the opening on the basin. Make sure that the tube is attached to the basin tightly to prevent leakage. Sit on the basin and release the clamp. This will allow warm water to flow into the basin and flush the area around your genitals and anus. Remain sitting on the basin for about 15-20 minutes, or as long as told by your health care provider. Stand up and gently pat your skin dry. If directed, apply clean bandages (dressings) to the affected area as told by your health care provider. Carefully remove the basin from the toilet seat and tip the basin into the toilet to empty any remaining water. Empty any remaining water from the plastic bag into the toilet. Then, flush the toilet. Wash the basin with warm water and soap. Let the basin air dry in the sink. You should also let the plastic bag and the tubing air dry. Store the basin, tubing, and plastic bag in a clean, dry area. Wash your hands with soap and  water. If soap and water are not available, use hand sanitizer. Contact a health care provider if: You have symptoms that get worse instead of better. You develop new skin irritation, redness, or swelling around your genitals or anus. This information is not intended to replace advice given to you by your health care provider. Make sure you discuss any questions you have with your health care provider. Document Released: 05/04/2012 Document Revised: 04/10/2016 Document Reviewed: 09/23/2015 Elsevier Interactive Patient Education  2018 [employer].   How to Take a [employer] A sitz bath is a warm water bath that is taken while you are sitting down. The water should only come up to your hips and should cover your buttocks. Your health care provider may recommend a sitz bath to help you: Clean the lower part of your body, including your genital area. With itching. With pain. With sore muscles or muscles that tighten or spasm.  How to  take a sitz bath Take 3-4 sitz baths per day or as told by your health care provider. Partially fill a bathtub with warm water. You will only need the water to be deep enough to cover your hips and buttocks when you are sitting in it. If your health care provider told you to put medicine in the water, follow the directions exactly. Sit in the water and open the tub drain a little. Turn on the warm water again to keep the tub at the correct level. Keep the water running constantly. Soak in the water for 15-20 minutes or as told by your health care provider. After the sitz bath, pat the affected area dry first. Do not rub it. Be careful when you stand up after the sitz bath because you may feel dizzy.  Contact a health care provider if: Your symptoms get worse. Do not continue with sitz baths if your symptoms get worse. You have new symptoms. Do not continue with sitz baths until you talk with your health care provider. This information is not intended to replace  advice given to you by your health care provider. Make sure you discuss any questions you have with your health care provider. Document Released: 07/26/2004 Document Revised: 04/02/2016 Document Reviewed: 11/01/2014 Elsevier Interactive Patient Education  [employer].

## 2024-12-19 ENCOUNTER — Inpatient Hospital Stay
Admission: RE | Admit: 2024-12-19 | Discharge: 2024-12-19 | Disposition: A | Source: Ambulatory Visit | Attending: Surgery

## 2024-12-19 ENCOUNTER — Other Ambulatory Visit: Payer: Self-pay

## 2024-12-19 DIAGNOSIS — Z01810 Encounter for preprocedural cardiovascular examination: Secondary | ICD-10-CM

## 2024-12-19 DIAGNOSIS — Z01818 Encounter for other preprocedural examination: Secondary | ICD-10-CM

## 2024-12-19 DIAGNOSIS — K649 Unspecified hemorrhoids: Secondary | ICD-10-CM

## 2024-12-19 DIAGNOSIS — I10 Essential (primary) hypertension: Secondary | ICD-10-CM

## 2024-12-19 DIAGNOSIS — E119 Type 2 diabetes mellitus without complications: Secondary | ICD-10-CM

## 2024-12-19 HISTORY — DX: Radiculopathy, lumbar region: M54.16

## 2024-12-19 HISTORY — DX: Family history of other specified conditions: Z84.89

## 2024-12-19 HISTORY — DX: Unspecified hemorrhoids: K64.9

## 2024-12-19 HISTORY — DX: Type 2 diabetes mellitus without complications: E11.9

## 2024-12-19 HISTORY — DX: Hyperlipidemia, unspecified: E78.5

## 2024-12-19 HISTORY — DX: Unspecified asthma, uncomplicated: J45.909

## 2024-12-19 HISTORY — DX: Sleep apnea, unspecified: G47.30

## 2024-12-20 ENCOUNTER — Inpatient Hospital Stay: Admission: RE | Admit: 2024-12-20 | Discharge: 2024-12-20 | Attending: Surgery

## 2024-12-20 ENCOUNTER — Encounter: Payer: Self-pay | Admitting: Urgent Care

## 2024-12-20 DIAGNOSIS — E119 Type 2 diabetes mellitus without complications: Secondary | ICD-10-CM | POA: Insufficient documentation

## 2024-12-20 DIAGNOSIS — Z01810 Encounter for preprocedural cardiovascular examination: Secondary | ICD-10-CM | POA: Insufficient documentation

## 2024-12-20 DIAGNOSIS — R9431 Abnormal electrocardiogram [ECG] [EKG]: Secondary | ICD-10-CM | POA: Insufficient documentation

## 2024-12-20 DIAGNOSIS — I10 Essential (primary) hypertension: Secondary | ICD-10-CM | POA: Insufficient documentation

## 2024-12-21 NOTE — Anesthesia Preprocedure Evaluation (Signed)
"                                    Anesthesia Evaluation  Patient identified by MRN, date of birth, ID band Patient awake    Reviewed: Allergy & Precautions, H&P , NPO status , Patient's Chart, lab work & pertinent test results  Airway Mallampati: III  TM Distance: >3 FB Neck ROM: full    Dental no notable dental hx.    Pulmonary asthma , sleep apnea , former smoker   Pulmonary exam normal        Cardiovascular hypertension, Normal cardiovascular exam     Neuro/Psych negative neurological ROS  negative psych ROS   GI/Hepatic negative GI ROS, Neg liver ROS,,,  Endo/Other  diabetes    Renal/GU      Musculoskeletal   Abdominal  (+) + obese  Peds  Hematology negative hematology ROS (+)   Anesthesia Other Findings Past Medical History: No date: Asthma No date: DM (diabetes mellitus), type 2 (HCC) No date: Family history of adverse reaction to anesthesia     Comment:  mom-n/v No date: Hemorrhoids No date: Hyperlipidemia No date: Hypertension No date: Lumbar radiculopathy 10/12/2024: Shingles No date: Sleep apnea  Past Surgical History: No date: COLONOSCOPY 2003: HEMORRHOID SURGERY No date: TIBIA FRACTURE SURGERY; Left     Reproductive/Obstetrics negative OB ROS                              Anesthesia Physical Anesthesia Plan  ASA: 2  Anesthesia Plan: General LMA   Post-op Pain Management: Tylenol  PO (pre-op)*, Gabapentin  PO (pre-op)* and Toradol  IV (intra-op)*   Induction: Intravenous  PONV Risk Score and Plan: Dexamethasone , Ondansetron , Midazolam  and Treatment may vary due to age or medical condition  Airway Management Planned: LMA  Additional Equipment:   Intra-op Plan:   Post-operative Plan: Extubation in OR  Informed Consent: I have reviewed the patients History and Physical, chart, labs and discussed the procedure including the risks, benefits and alternatives for the proposed anesthesia with the  patient or authorized representative who has indicated his/her understanding and acceptance.     Dental Advisory Given  Plan Discussed with: Anesthesiologist, CRNA and Surgeon  Anesthesia Plan Comments:          Anesthesia Quick Evaluation  "

## 2024-12-22 ENCOUNTER — Encounter: Admission: RE | Disposition: A | Payer: Self-pay | Source: Home / Self Care | Attending: Surgery

## 2024-12-22 ENCOUNTER — Ambulatory Visit: Admission: RE | Admit: 2024-12-22 | Source: Home / Self Care | Admitting: Surgery

## 2024-12-22 ENCOUNTER — Encounter: Payer: Self-pay | Admitting: Surgery

## 2024-12-22 ENCOUNTER — Encounter: Payer: Self-pay | Admitting: Urgent Care

## 2024-12-22 ENCOUNTER — Encounter: Payer: Self-pay | Admitting: Anesthesiology

## 2024-12-22 ENCOUNTER — Other Ambulatory Visit: Payer: Self-pay

## 2024-12-22 DIAGNOSIS — Z01818 Encounter for other preprocedural examination: Secondary | ICD-10-CM

## 2024-12-22 DIAGNOSIS — K648 Other hemorrhoids: Secondary | ICD-10-CM

## 2024-12-22 DIAGNOSIS — K644 Residual hemorrhoidal skin tags: Secondary | ICD-10-CM

## 2024-12-22 LAB — GLUCOSE, CAPILLARY
Glucose-Capillary: 208 mg/dL — ABNORMAL HIGH (ref 70–99)
Glucose-Capillary: 198 mg/dL — ABNORMAL HIGH (ref 70–99)

## 2024-12-22 MED ORDER — CHLORHEXIDINE GLUCONATE 0.12 % MT SOLN
OROMUCOSAL | Status: AC
  Filled 2024-12-22: qty 15

## 2024-12-22 MED ORDER — FENTANYL CITRATE (PF) 100 MCG/2ML IJ SOLN
25.0000 ug | INTRAMUSCULAR | Status: DC | PRN
  Administered 2024-12-22 (×2): 50 ug via INTRAVENOUS

## 2024-12-22 MED ORDER — EPHEDRINE SULFATE-NACL 50-0.9 MG/10ML-% IV SOSY
PREFILLED_SYRINGE | INTRAVENOUS | Status: DC | PRN
  Administered 2024-12-22 (×2): 10 mg via INTRAVENOUS

## 2024-12-22 MED ORDER — MIDAZOLAM HCL 2 MG/2ML IJ SOLN
INTRAMUSCULAR | Status: AC
  Filled 2024-12-22: qty 2

## 2024-12-22 MED ORDER — GELATIN ABSORBABLE 100 EX MISC
CUTANEOUS | Status: DC | PRN
  Administered 2024-12-22: 1

## 2024-12-22 MED ORDER — PHENYLEPHRINE 80 MCG/ML (10ML) SYRINGE FOR IV PUSH (FOR BLOOD PRESSURE SUPPORT)
PREFILLED_SYRINGE | INTRAVENOUS | Status: DC | PRN
  Administered 2024-12-22 (×2): 80 ug via INTRAVENOUS

## 2024-12-22 MED ORDER — MIDAZOLAM HCL (PF) 2 MG/2ML IJ SOLN
INTRAMUSCULAR | Status: DC | PRN
  Administered 2024-12-22: 2 mg via INTRAVENOUS

## 2024-12-22 MED ORDER — OXYCODONE HCL 5 MG PO TABS: 5.0000 mg | ORAL_TABLET | ORAL | 0 refills | Status: AC | PRN

## 2024-12-22 MED ORDER — BUPIVACAINE LIPOSOME 1.3 % IJ SUSP: 20.0000 mL | Freq: Once | INTRAMUSCULAR | Status: DC

## 2024-12-22 MED ORDER — ORAL CARE MOUTH RINSE: 15.0000 mL | Freq: Once | OROMUCOSAL | Status: AC

## 2024-12-22 MED ORDER — GABAPENTIN 300 MG PO CAPS
ORAL_CAPSULE | ORAL | Status: AC
  Filled 2024-12-22: qty 1

## 2024-12-22 MED ORDER — GLYCOPYRROLATE 0.2 MG/ML IJ SOLN
INTRAMUSCULAR | Status: DC | PRN
  Administered 2024-12-22: .2 mg via INTRAVENOUS

## 2024-12-22 MED ORDER — GELATIN ABSORBABLE 12-7 MM EX MISC
CUTANEOUS | Status: AC
  Filled 2024-12-22: qty 1

## 2024-12-22 MED ORDER — SEVOFLURANE IN SOLN
RESPIRATORY_TRACT | Status: AC
  Filled 2024-12-22: qty 250

## 2024-12-22 MED ORDER — BUPIVACAINE-EPINEPHRINE (PF) 0.5% -1:200000 IJ SOLN
INTRAMUSCULAR | Status: DC | PRN
  Administered 2024-12-22: 50 mL

## 2024-12-22 MED ORDER — FENTANYL CITRATE (PF) 100 MCG/2ML IJ SOLN
INTRAMUSCULAR | Status: AC
  Filled 2024-12-22: qty 2

## 2024-12-22 MED ORDER — FLEET ENEMA RE ENEM: 1.0000 | ENEMA | Freq: Once | RECTAL | Status: DC

## 2024-12-22 MED ORDER — LIDOCAINE HCL (CARDIAC) PF 100 MG/5ML IV SOSY
PREFILLED_SYRINGE | INTRAVENOUS | Status: DC | PRN
  Administered 2024-12-22: 100 mg via INTRAVENOUS

## 2024-12-22 MED ORDER — SODIUM CHLORIDE 0.9 % IV SOLN
2.0000 g | INTRAVENOUS | Status: AC
  Administered 2024-12-22: 2 g via INTRAVENOUS

## 2024-12-22 MED ORDER — LIDOCAINE 5 % EX OINT: 1.0000 | TOPICAL_OINTMENT | Freq: Four times a day (QID) | CUTANEOUS | 0 refills | Status: AC | PRN

## 2024-12-22 MED ORDER — CELECOXIB 200 MG PO CAPS: 200.0000 mg | ORAL_CAPSULE | Freq: Every day | ORAL | 1 refills | Status: AC

## 2024-12-22 MED ORDER — SODIUM CHLORIDE 0.9 % IV SOLN: INTRAVENOUS | Status: DC

## 2024-12-22 MED ORDER — PROPOFOL 10 MG/ML IV BOLUS
INTRAVENOUS | Status: AC
  Filled 2024-12-22: qty 40

## 2024-12-22 MED ORDER — 0.9 % SODIUM CHLORIDE (POUR BTL) OPTIME
TOPICAL | Status: DC | PRN
  Administered 2024-12-22: 500 mL

## 2024-12-22 MED ORDER — OXYCODONE HCL 5 MG/5ML PO SOLN: 5.0000 mg | Freq: Once | ORAL | Status: AC | PRN

## 2024-12-22 MED ORDER — GABAPENTIN 300 MG PO CAPS
300.0000 mg | ORAL_CAPSULE | ORAL | Status: AC
  Administered 2024-12-22: 300 mg via ORAL

## 2024-12-22 MED ORDER — SODIUM CHLORIDE 0.9 % IV SOLN
INTRAVENOUS | Status: AC
  Filled 2024-12-22: qty 2

## 2024-12-22 MED ORDER — ONDANSETRON HCL 4 MG/2ML IJ SOLN
INTRAMUSCULAR | Status: DC | PRN
  Administered 2024-12-22: 4 mg via INTRAVENOUS

## 2024-12-22 MED ORDER — INSULIN ASPART 100 UNIT/ML IJ SOLN
INTRAMUSCULAR | Status: AC
  Filled 2024-12-22: qty 5

## 2024-12-22 MED ORDER — BUPIVACAINE-EPINEPHRINE (PF) 0.5% -1:200000 IJ SOLN
INTRAMUSCULAR | Status: AC
  Filled 2024-12-22: qty 30

## 2024-12-22 MED ORDER — DEXMEDETOMIDINE HCL IN NACL 80 MCG/20ML IV SOLN
INTRAVENOUS | Status: DC | PRN
  Administered 2024-12-22: 8 ug via INTRAVENOUS
  Administered 2024-12-22: 12 ug via INTRAVENOUS

## 2024-12-22 MED ORDER — OXYCODONE HCL 5 MG PO TABS
5.0000 mg | ORAL_TABLET | Freq: Once | ORAL | Status: AC | PRN
  Administered 2024-12-22: 5 mg via ORAL

## 2024-12-22 MED ORDER — CHLORHEXIDINE GLUCONATE 0.12 % MT SOLN
15.0000 mL | Freq: Once | OROMUCOSAL | Status: AC
  Administered 2024-12-22: 15 mL via OROMUCOSAL

## 2024-12-22 MED ORDER — PROPOFOL 10 MG/ML IV BOLUS
INTRAVENOUS | Status: DC | PRN
  Administered 2024-12-22: 200 mg via INTRAVENOUS

## 2024-12-22 MED ORDER — OXYCODONE HCL 5 MG PO TABS
ORAL_TABLET | ORAL | Status: AC
  Filled 2024-12-22: qty 1

## 2024-12-22 MED ORDER — CHLORHEXIDINE GLUCONATE CLOTH 2 % EX PADS: 6.0000 | MEDICATED_PAD | Freq: Once | CUTANEOUS | Status: DC

## 2024-12-22 MED ORDER — INSULIN ASPART 100 UNIT/ML IJ SOLN
5.0000 [IU] | Freq: Once | INTRAMUSCULAR | Status: AC
  Administered 2024-12-22: 5 [IU] via SUBCUTANEOUS

## 2024-12-22 MED ORDER — ESMOLOL HCL 100 MG/10ML IV SOLN
INTRAVENOUS | Status: DC | PRN
  Administered 2024-12-22 (×2): 20 mg via INTRAVENOUS

## 2024-12-22 MED ORDER — BUPIVACAINE LIPOSOME 1.3 % IJ SUSP
INTRAMUSCULAR | Status: AC
  Filled 2024-12-22: qty 20

## 2024-12-22 MED ORDER — FENTANYL CITRATE (PF) 100 MCG/2ML IJ SOLN
INTRAMUSCULAR | Status: DC | PRN
  Administered 2024-12-22 (×2): 50 ug via INTRAVENOUS
  Administered 2024-12-22: 100 ug via INTRAVENOUS

## 2024-12-22 MED ORDER — DEXAMETHASONE SOD PHOSPHATE PF 10 MG/ML IJ SOLN
INTRAMUSCULAR | Status: DC | PRN
  Administered 2024-12-22: 5 mg via INTRAVENOUS

## 2024-12-22 MED ORDER — ACETAMINOPHEN 10 MG/ML IV SOLN: 1000.0000 mg | Freq: Once | INTRAVENOUS | Status: DC | PRN

## 2024-12-22 MED ORDER — ACETAMINOPHEN 500 MG PO TABS: 1000.0000 mg | ORAL_TABLET | Freq: Four times a day (QID) | ORAL | Status: AC | PRN

## 2024-12-22 MED ORDER — ACETAMINOPHEN 500 MG PO TABS
ORAL_TABLET | ORAL | Status: AC
  Filled 2024-12-22: qty 2

## 2024-12-22 MED ORDER — DROPERIDOL 2.5 MG/ML IJ SOLN: 0.6250 mg | Freq: Once | INTRAMUSCULAR | Status: DC | PRN

## 2024-12-22 MED ORDER — ACETAMINOPHEN 500 MG PO TABS
1000.0000 mg | ORAL_TABLET | ORAL | Status: AC
  Administered 2024-12-22: 1000 mg via ORAL

## 2024-12-22 NOTE — Interval H&P Note (Signed)
 History and Physical Interval Note:  12/22/2024 7:06 AM  Jeremiah Brock  has presented today for surgery, with the diagnosis of internal and external hemorrhoids.  The various methods of treatment have been discussed with the patient and family. After consideration of risks, benefits and other options for treatment, the patient has consented to  Procedures: EXAM UNDER ANESTHESIA, RECTUM (N/A) HEMORRHOIDECTOMY (N/A) as a surgical intervention.  The patient's history has been reviewed, patient examined, no change in status, stable for surgery.  I have reviewed the patient's chart and labs.  Questions were answered to the patient's satisfaction.     Gustava Berland

## 2024-12-22 NOTE — Op Note (Signed)
 Procedure Date:  12/22/2024  Pre-operative Diagnosis:  Enlarged internal and external hemorrhoids  Post-operative Diagnosis: Enlarged internal and external hemorrhoids of all three columns  Procedure:  Exam under Anesthesia, Hemorrhoidectomy of right posterior and left lateral columns  Surgeon:  [name] Sheree Plant, MD  Anesthesia:  General endotracheal  Estimated Blood Loss:  20 ml  Specimens: Left lateral hemorrhoid Right posterior hemorrhoid  Complications:  None  Indications for Procedure:  This is a 54 y.o. male with a history of inflamed external hemorrhoids, presenting for hemorrhoidectomy.  The risks of bleeding, infection, bowel injury, and need for further procedures were all discussed with the patient and he was willing to proceed.   Description of Procedure: The patient was correctly identified in the preoperative area and brought into the operating room.  The patient was placed supine with VTE prophylaxis in place.  Appropriate time-outs were performed.  Anesthesia was induced and the patient was intubated.  Appropriate antibiotics were infused.  The patient was then placed in high lithotomy position.   The perianal area was prepped and draped in usual sterile fashion.  The sphincter was digitally dilated.  Then the anoscope was inserted and the anal canal was evaluated, revealing enlarged external and internal hemorrhoids of all three columns.  The right posterior and left lateral were the largest and it was decided to excise those two only to decrease the risk of anal canal stenosis.  The bivalve retractor was then inserted, and we proceeded with excision.  The skin of the left lateral external hemorrhoid was incised with cautery and then Harmonic was used to take down the column all the way to it's pedicle, with careful attention for the sphincter musculature.  We then proceeded with the same for the right posterior column.  Any bleeding was controlled with Harmonic and cautery.   After, the anal canal was irrigated.  50 ml of Exparel  solution was infiltrated into the perianal area and as bilateral pudendal blocks.  A large gelfoam gauze was rolled and inserted into the anal canal for further hemostasis.  The perianal area was cleaned and dressed with fluffed gauze and mesh underwear.  The patient was emerged from anesthesia and extubated and brought to the recovery room for further management.  The patient tolerated the procedure well and all counts were correct at the end of the case.   [name] Sheree Plant, MD

## 2024-12-22 NOTE — Anesthesia Procedure Notes (Signed)
 Procedure Name: LMA Insertion Date/Time: 12/22/2024 7:33 AM  Performed by: Rosine Shona Jansky, CRNAPre-anesthesia Checklist: Patient identified, Emergency Drugs available, Suction available and Patient being monitored Patient Re-evaluated:Patient Re-evaluated prior to induction Oxygen Delivery Method: Circle system utilized Preoxygenation: Pre-oxygenation with 100% oxygen Induction Type: IV induction Ventilation: Mask ventilation without difficulty LMA: LMA inserted LMA Size: 5.0 Number of attempts: 1 Airway Equipment and Method: Stylet and Oral airway Placement Confirmation: positive ETCO2 and breath sounds checked- equal and bilateral Tube secured with: Tape Dental Injury: Teeth and Oropharynx as per pre-operative assessment

## 2024-12-22 NOTE — Transfer of Care (Signed)
 Immediate Anesthesia Transfer of Care Note  Patient: Jeremiah Brock  Procedure(s) Performed: ERASMO UNDER ANESTHESIA, RECTUM HEMORRHOIDECTOMY  Patient Location: PACU  Anesthesia Type:General  Level of Consciousness: drowsy  Airway & Oxygen Therapy: Patient Spontanous Breathing and Patient connected to face mask oxygen  Post-op Assessment: Report given to RN  Post vital signs: stable  Last Vitals:  Vitals Value Taken Time  BP    Temp    Pulse    Resp    SpO2      Last Pain:  Vitals:   12/22/24 0623  TempSrc: Temporal  PainSc: 0-No pain         Complications: No notable events documented.

## 2024-12-22 NOTE — Anesthesia Postprocedure Evaluation (Signed)
"   Anesthesia Post Note  Patient: Jeremiah Brock  Procedure(s) Performed: EXAM UNDER ANESTHESIA, RECTUM HEMORRHOIDECTOMY  Patient location during evaluation: PACU Anesthesia Type: General Level of consciousness: awake and alert Pain management: pain level controlled Vital Signs Assessment: post-procedure vital signs reviewed and stable Respiratory status: spontaneous breathing, nonlabored ventilation and respiratory function stable Cardiovascular status: blood pressure returned to baseline and stable Postop Assessment: no apparent nausea or vomiting Anesthetic complications: no   No notable events documented.   Last Vitals:  Vitals:   12/22/24 0940 12/22/24 0958  BP:  (!) 136/104  Pulse: 85 80  Resp: 14 20  Temp:  (!) 36.1 C  SpO2: 100% 97%    Last Pain:  Vitals:   12/22/24 0958  TempSrc: Temporal  PainSc: 7                  Camellia Merilee Louder      "

## 2024-12-22 NOTE — Discharge Instructions (Signed)
 Discharge Instructions: 1.  Patient may shower, but do not scrub wounds heavily and dab dry only. 2.  Do Sitz baths twice daily and after bowel movements. 3.  Use baby wipes or flushable wipes to clean after bowel movements. 4.  Avoid strenuous activity for the first two weeks to decrease the pressure in the pelvis.   5.  Can start using Lidocaine  ointment on 12/25/24.  Do not use it before then. 6.  You have a dissolvable gauze inside the anal canal.  You will pass this when having flatus or bowel movement.  Once out, can discard. 7.  May apply fluffed gauze dressing over the perianal area to catch any drainage and for padding/comfort. 8.  Continue taking Miralax daily to avoid constipation. 9.  Start taking Prilosec over the counter once daily while taking Celebrex  to help prevent stomach ulcers. 10.  Do not drive while taking narcotics for pain control.  Prior to driving, make sure you are able to rotate right and left to look at blindspots without significant pain or discomfort.

## 2024-12-23 ENCOUNTER — Encounter: Payer: Self-pay | Admitting: Surgery

## 2024-12-23 LAB — SURGICAL PATHOLOGY

## 2025-01-09 ENCOUNTER — Encounter: Admitting: Surgery
